# Patient Record
Sex: Male | Born: 1980 | Race: Black or African American | Hispanic: No | Marital: Married | Smoking: Current every day smoker
Health system: Southern US, Community
[De-identification: ages and names within clinical notes are randomized; demographics above are authoritative.]

## PROBLEM LIST (undated history)

## (undated) DIAGNOSIS — K6289 Other specified diseases of anus and rectum: Secondary | ICD-10-CM

## (undated) DIAGNOSIS — R05 Cough: Secondary | ICD-10-CM

## (undated) DIAGNOSIS — R059 Cough, unspecified: Secondary | ICD-10-CM

---
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
## 2000-05-04 ENCOUNTER — Emergency Department (HOSPITAL_COMMUNITY): Admission: EM | Admit: 2000-05-04 | Discharge: 2000-05-04 | Payer: Self-pay | Admitting: Emergency Medicine

## 2000-05-04 ENCOUNTER — Encounter: Payer: Self-pay | Admitting: Emergency Medicine

## 2001-08-20 ENCOUNTER — Emergency Department (HOSPITAL_COMMUNITY): Admission: EM | Admit: 2001-08-20 | Discharge: 2001-08-20 | Payer: Self-pay | Admitting: Emergency Medicine

## 2003-02-15 ENCOUNTER — Emergency Department (HOSPITAL_COMMUNITY): Admission: EM | Admit: 2003-02-15 | Discharge: 2003-02-15 | Payer: Self-pay | Admitting: Emergency Medicine

## 2005-08-07 ENCOUNTER — Emergency Department (HOSPITAL_COMMUNITY): Admission: EM | Admit: 2005-08-07 | Discharge: 2005-08-07 | Payer: Self-pay | Admitting: Emergency Medicine

## 2005-11-25 ENCOUNTER — Emergency Department (HOSPITAL_COMMUNITY): Admission: EM | Admit: 2005-11-25 | Discharge: 2005-11-25 | Payer: Self-pay | Admitting: Emergency Medicine

## 2006-02-12 ENCOUNTER — Emergency Department (HOSPITAL_COMMUNITY): Admission: EM | Admit: 2006-02-12 | Discharge: 2006-02-12 | Payer: Self-pay | Admitting: Emergency Medicine

## 2006-03-19 ENCOUNTER — Emergency Department (HOSPITAL_COMMUNITY): Admission: EM | Admit: 2006-03-19 | Discharge: 2006-03-19 | Payer: Self-pay | Admitting: Emergency Medicine

## 2006-07-14 HISTORY — PX: LIPOSUCTION TRUNK: SUR833

## 2006-07-23 ENCOUNTER — Emergency Department (HOSPITAL_COMMUNITY): Admission: EM | Admit: 2006-07-23 | Discharge: 2006-07-24 | Payer: Self-pay | Admitting: Emergency Medicine

## 2006-12-14 ENCOUNTER — Emergency Department (HOSPITAL_COMMUNITY): Admission: EM | Admit: 2006-12-14 | Discharge: 2006-12-14 | Payer: Self-pay | Admitting: Emergency Medicine

## 2007-08-10 IMAGING — CR DG CHEST 2V
2 series · 2 of 2 positions shown · non-contrast
Comparison: none

CLINICAL DATA: Fever.  Nausea and vomiting.  Flu symptoms. 
 CHEST - 2 VIEW:

[w chest pa]
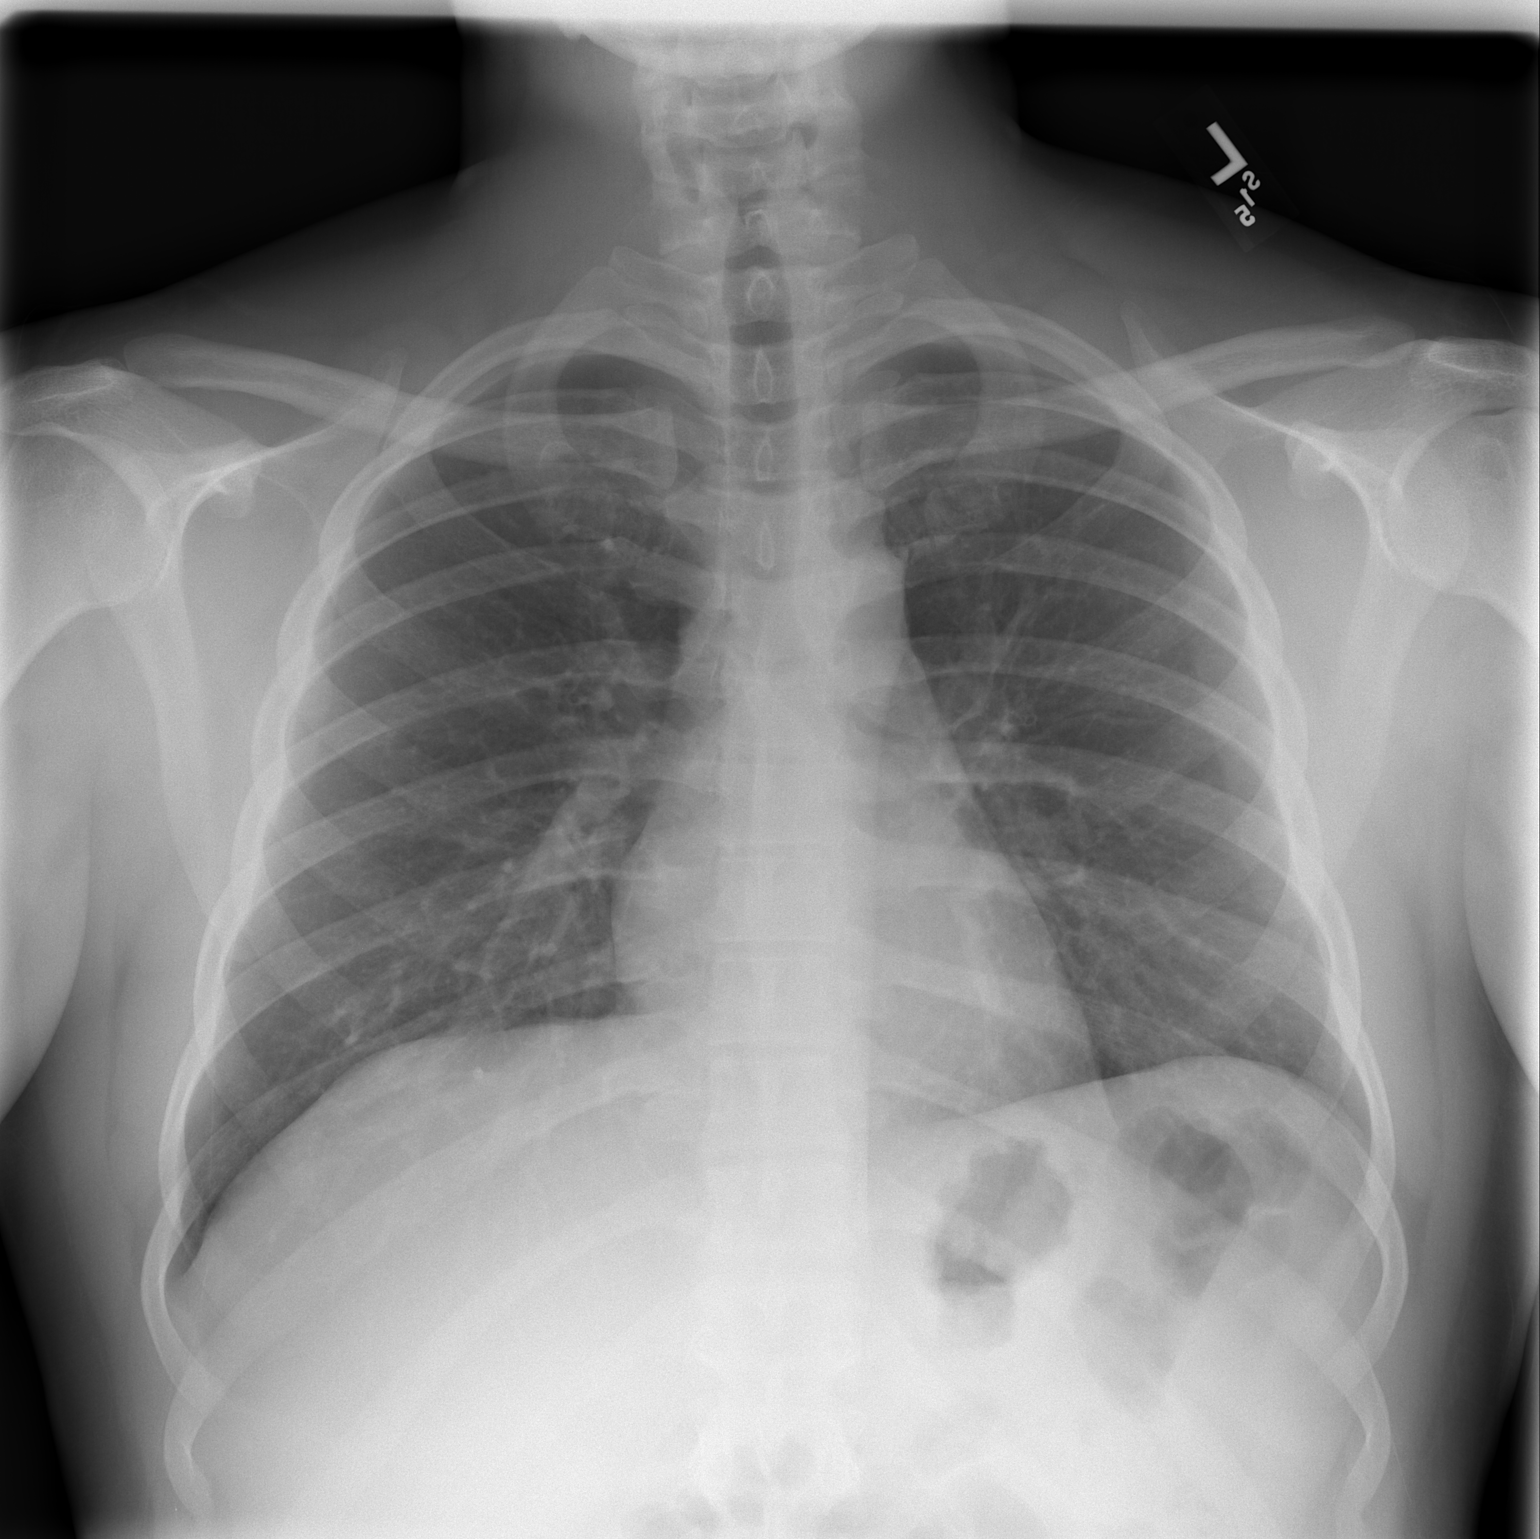

[w chest lat]
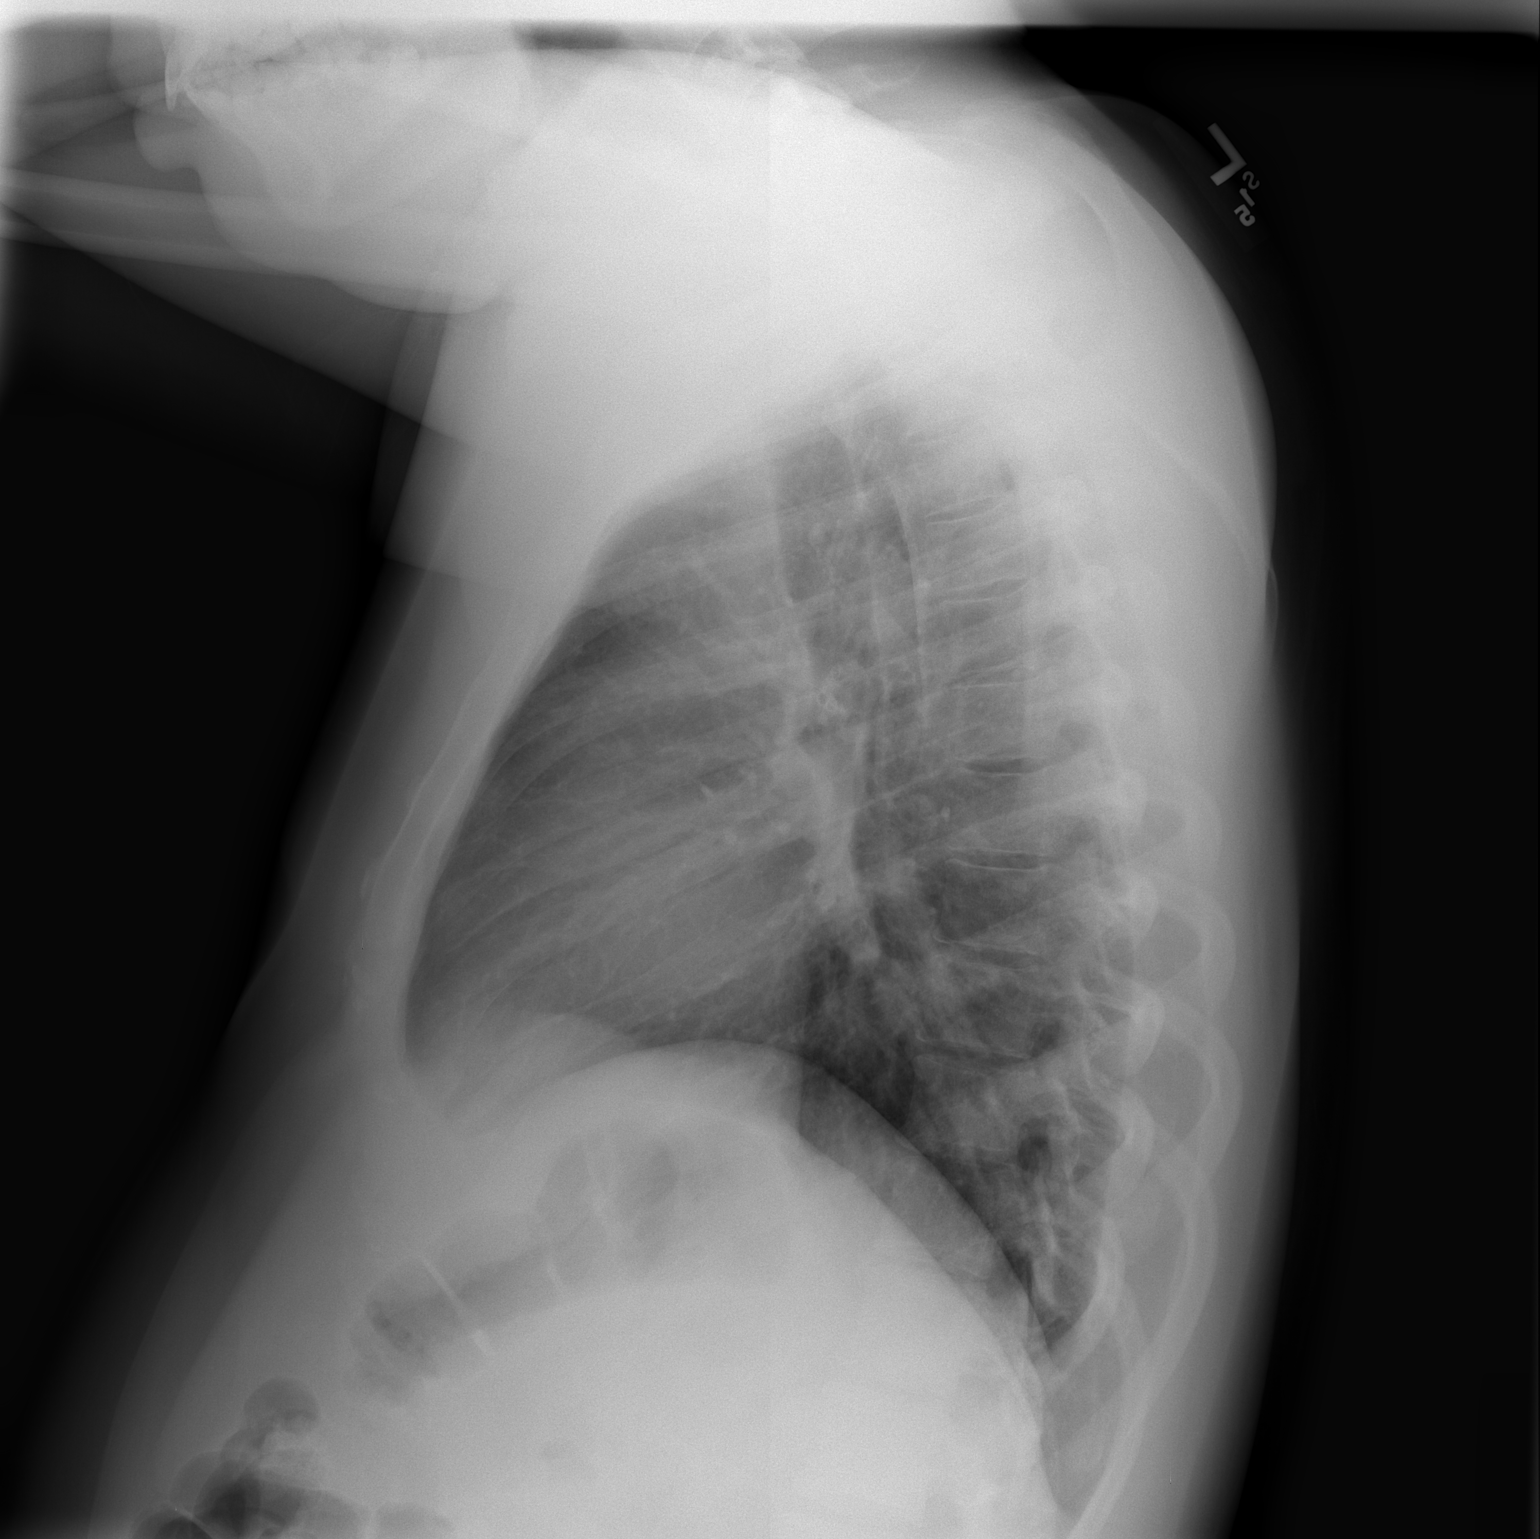

[2 of 2 positions shown; findings below may reference images not displayed]

FINDINGS: The heart size and mediastinal contours are within normal limits.  Both lungs are clear.  The visualized skeletal structures are unremarkable.
IMPRESSION: No active cardiopulmonary disease.

## 2007-11-24 ENCOUNTER — Emergency Department (HOSPITAL_COMMUNITY): Admission: EM | Admit: 2007-11-24 | Discharge: 2007-11-24 | Payer: Self-pay | Admitting: Emergency Medicine

## 2008-02-28 ENCOUNTER — Emergency Department (HOSPITAL_COMMUNITY): Admission: EM | Admit: 2008-02-28 | Discharge: 2008-02-29 | Payer: Self-pay | Admitting: Emergency Medicine

## 2008-06-26 ENCOUNTER — Emergency Department (HOSPITAL_COMMUNITY): Admission: EM | Admit: 2008-06-26 | Discharge: 2008-06-26 | Payer: Self-pay | Admitting: Emergency Medicine

## 2010-10-16 ENCOUNTER — Emergency Department (HOSPITAL_COMMUNITY)
Admission: EM | Admit: 2010-10-16 | Discharge: 2010-10-16 | Disposition: A | Discharge status: Home / Self Care | Payer: Self-pay | Attending: Emergency Medicine | Admitting: Emergency Medicine

## 2010-10-16 ENCOUNTER — Emergency Department (HOSPITAL_COMMUNITY): Payer: Self-pay

## 2010-10-16 DIAGNOSIS — R05 Cough: Secondary | ICD-10-CM | POA: Insufficient documentation

## 2010-10-16 DIAGNOSIS — R059 Cough, unspecified: Secondary | ICD-10-CM | POA: Insufficient documentation

## 2010-10-16 DIAGNOSIS — J45909 Unspecified asthma, uncomplicated: Secondary | ICD-10-CM | POA: Insufficient documentation

## 2010-10-17 ENCOUNTER — Emergency Department (HOSPITAL_COMMUNITY)
Admission: EM | Admit: 2010-10-17 | Discharge: 2010-10-17 | Disposition: A | Discharge status: Home / Self Care | Payer: Self-pay | Attending: Emergency Medicine | Admitting: Emergency Medicine

## 2010-10-17 DIAGNOSIS — J40 Bronchitis, not specified as acute or chronic: Secondary | ICD-10-CM | POA: Insufficient documentation

## 2010-11-21 ENCOUNTER — Emergency Department (HOSPITAL_COMMUNITY)
Admission: EM | Admit: 2010-11-21 | Discharge: 2010-11-21 | Disposition: A | Discharge status: Home / Self Care | Payer: Self-pay | Attending: Emergency Medicine | Admitting: Emergency Medicine

## 2010-11-21 DIAGNOSIS — K6289 Other specified diseases of anus and rectum: Secondary | ICD-10-CM | POA: Insufficient documentation

## 2010-11-27 ENCOUNTER — Emergency Department (HOSPITAL_COMMUNITY)
Admission: EM | Admit: 2010-11-27 | Discharge: 2010-11-27 | Disposition: A | Discharge status: Home / Self Care | Payer: Self-pay | Attending: Emergency Medicine | Admitting: Emergency Medicine

## 2010-11-27 DIAGNOSIS — K6289 Other specified diseases of anus and rectum: Secondary | ICD-10-CM | POA: Insufficient documentation

## 2010-11-27 DIAGNOSIS — K5900 Constipation, unspecified: Secondary | ICD-10-CM | POA: Insufficient documentation

## 2011-04-18 LAB — CBC
HCT: 48.8 % (ref 39.0–52.0)
Platelets: 217 10*3/uL (ref 150–400)
WBC: 5.6 10*3/uL (ref 4.0–10.5)

## 2011-04-18 LAB — COMPREHENSIVE METABOLIC PANEL
ALT: 71 U/L — ABNORMAL HIGH (ref 0–53)
AST: 43 U/L — ABNORMAL HIGH (ref 0–37)
Albumin: 4 g/dL (ref 3.5–5.2)
Alkaline Phosphatase: 39 U/L (ref 39–117)
Chloride: 103 mEq/L (ref 96–112)
GFR calc Af Amer: >60 mL/min (ref >60)
Potassium: 3.9 mEq/L (ref 3.5–5.1)
Sodium: 135 mEq/L (ref 135–145)
Total Bilirubin: 0.6 mg/dL (ref 0.3–1.2)
Total Protein: 6.5 g/dL (ref 6.0–8.3)

## 2011-04-18 LAB — DIFFERENTIAL
Basophils Absolute: 0 10*3/uL (ref 0.0–0.1)
Basophils Relative: 0 % (ref 0–1)
Eosinophils Relative: 3 % (ref 0–5)
Monocytes Absolute: 0.6 10*3/uL (ref 0.1–1.0)
Monocytes Relative: 11 % (ref 3–12)

## 2013-09-20 ENCOUNTER — Encounter (HOSPITAL_COMMUNITY): Payer: Self-pay | Admitting: Emergency Medicine

## 2013-09-20 ENCOUNTER — Emergency Department (HOSPITAL_COMMUNITY)
Admission: EM | Admit: 2013-09-20 | Discharge: 2013-09-20 | Disposition: A | Discharge status: Home / Self Care | Payer: Self-pay | Attending: Emergency Medicine | Admitting: Emergency Medicine

## 2013-09-20 DIAGNOSIS — K921 Melena: Secondary | ICD-10-CM | POA: Insufficient documentation

## 2013-09-20 DIAGNOSIS — Z87891 Personal history of nicotine dependence: Secondary | ICD-10-CM | POA: Insufficient documentation

## 2013-09-20 DIAGNOSIS — K6289 Other specified diseases of anus and rectum: Secondary | ICD-10-CM | POA: Insufficient documentation

## 2013-09-20 MED ORDER — NITROGLYCERIN 2 % TD OINT: 1.0000 [in_us] | TOPICAL_OINTMENT | Freq: Two times a day (BID) | TRANSDERMAL | Status: DC

## 2013-09-20 MED ORDER — NITROGLYCERIN 0.4 % RE OINT: 1.0000 [in_us] | TOPICAL_OINTMENT | Freq: Two times a day (BID) | RECTAL | Status: DC

## 2013-09-20 MED ORDER — DOCUSATE SODIUM 100 MG PO CAPS: 100.0000 mg | ORAL_CAPSULE | Freq: Two times a day (BID) | ORAL | Status: DC

## 2013-09-20 MED ORDER — POLYETHYLENE GLYCOL 3350 17 G PO PACK: 17.0000 g | PACK | Freq: Every day | ORAL | Status: DC

## 2013-09-20 NOTE — ED Provider Notes (Signed)
CSN: [id number]     Arrival date & time 09/20/13  1904 History   First MD Initiated Contact with Patient 09/20/13 2124     Chief Complaint  Patient presents with  . Anal Fissure     (Consider location/radiation/quality/duration/timing/severity/associated sxs/prior Treatment) HPI Comments: 33 yo male presents with rectal pain since yesterday. States it feels similar to the time he was diagnosed with anal fissure 2-3 years ago. Was seen by a surgeon who put him on nitroglycerin ointment and his pain resolved in 2-3 days. Now feels like he has a recurrence. Has had constipation (BM every 2-3 days) with hard stools. Has constant rectal pain. Denies pain on the inside. No abdominal pain. Has had scant amounts of rectal bleeding, no blood mixed with stools. No fevers.   History reviewed. No pertinent past medical history. History reviewed. No pertinent past surgical history. History reviewed. No pertinent family history. History  Substance Use Topics  . Smoking status: Former Games developermoker  . Smokeless tobacco: Not on file  . Alcohol Use: No    Review of Systems  Constitutional: Negative for fever.  Gastrointestinal: Positive for constipation, anal bleeding and rectal pain. Negative for nausea, vomiting and abdominal pain.  All other systems reviewed and are negative.      Allergies  Review of patient's allergies indicates no known allergies.  Home Medications   Current Outpatient Rx  Name  Route  Sig  Dispense  Refill  . docusate sodium (COLACE) 100 MG capsule   Oral   Take 1 capsule (100 mg total) by mouth every 12 (twelve) hours.   60 capsule   0   . Nitroglycerin 0.4 % OINT   Rectal   Place 1 inch rectally 2 (two) times daily.   30 g   0   . polyethylene glycol (MIRALAX / GLYCOLAX) packet   Oral   Take 17 g by mouth daily.   14 each   0    BP 120/89  Pulse 90  Temp(Src) 98.2 F (36.8 C) (Oral)  Resp 18  SpO2 95% Physical Exam  Nursing note and vitals  reviewed. Constitutional: He is oriented to person, place, and time. He appears well-developed and well-nourished. No distress.  HENT:  Head: Normocephalic and atraumatic.  Right Ear: External ear normal.  Left Ear: External ear normal.  Nose: Nose normal.  Eyes: Right eye exhibits no discharge. Left eye exhibits no discharge.  Neck: Neck supple.  Cardiovascular: Normal rate.   Pulmonary/Chest: Effort normal.  Abdominal: Soft. He exhibits no distension. There is no tenderness.  Genitourinary:     Musculoskeletal: He exhibits no edema.  Neurological: He is alert and oriented to person, place, and time.  Skin: Skin is warm and dry.    ED Course  Procedures (including critical care time) Labs Review Labs Reviewed - No data to display Imaging Review No results found.   EKG Interpretation None      MDM   Final diagnoses:  Rectal pain    Patient refused internal rectal exam. He was not relaxed during external exam, and hard to see extent or amount of fissures. No external hemorrhoids seen. No erythema or abscess. No signs of acute blood loss given history, no anemia symptoms and normal vitals. Will treat symptomatically and refer back to surgeon.    Audree CamelScott T Harriet Bollen, MD 09/21/13 1007

## 2013-09-20 NOTE — Discharge Instructions (Signed)
Anal Fistula °An anal fistula is an abnormal tunnel that develops between the bowel and skin near the outside of the anus, where feces comes out. The anus has a number of tiny glands that make lubricating fluid. Sometimes these glands can become plugged and infected. This may lead to the development of a fluid-filled pocket (abscess). An anal fistula often develops after this infection or abscess. It is nearly always caused by a past or current anal abscess.  °CAUSES  °Though an anal fistula is almost always caused by a past or current anal abscess, other causes can include: °· A complication of surgery. °· Trauma to the rectal area. °· Radiation to the area. °· Other medical conditions or diseases, such as:   °· Chronic inflammatory bowel disease, such as Crohn disease or ulcerative colitis.   °· Colon or rectal cancer.   °· Diverticular disease, such as diverticulitis.   °· A sexually transmitted disease, such as gonorrhea, chlamydia, or syphilis. °· An HIV infection or AIDS.   °SYMPTOMS  °· Throbbing or constant pain that may be worse when sitting.   °· Swelling or irritation around the anus.   °· Drainage of pus or blood from an opening near the anus.   °· Pain with bowel movements. °· Fever or chills. °DIAGNOSIS  °Your caregiver will examine the area to find the openings of the anal fistula and the fistula tract. The external opening of the anal fistula may be seen during a physical examination. Other examinations that may be performed include:  °· Examination of the rectal area with a gloved hand (digital rectal exam).   °· Examination with a probe or scope to help locate the internal opening of the fistula.   °· Injection of a dye into the fistula opening. X-rays can be taken to find the exact location and path of the fistula.   °· An MRI or ultrasound of the anal area.   °Other tests may be performed to find the cause of the anal fistula.    °TREATMENT  °The most common treatment for an anal fistula is  surgery. There are different surgery options depending on where your fistula is located and how complex the fistula is. Surgical options include: °· A fistulotomy. This surgery involves opening up the whole fistula and draining the contents inside to promote healing. °· Seton placement. A silk string (seton) is placed into the fistula during a fistulotomy to drain any infection to promote healing. °· Advancement flap procedure. Tissue is removed from your rectum or the skin around the anus and is attached to the opening of the fistula. °· Bioprosthetic plug. A cone-shaped plug is made from your tissue and is used to block the opening of the fistula. °Some anal fistulas do not require surgery. A fibrin glue is a non-surgical option that involves injecting the glue to seal the fistula. You also may be prescribed an antibiotic medicine to treat an infection.  °HOME CARE INSTRUCTIONS  °· Take your antibiotics as directed. Finish them even if you start to feel better. °· Only take over-the-counter or prescription medicines as directed by your caregiver. Use a stool softener or laxative, if recommended.   °· Eat a high-fiber diet to help avoid constipation or as directed by your caregiver. °· Drink enough water to keep your urine clear or pale yellow.   °· A warm sitz bath may be soothing and help with healing. You may take warm sitz baths for 15 20 minutes, 3 4 times a day to ease pain and discomfort.   °· Follow excellent hygiene to keep the   anal area as clean and dry as possible. Use wet toilet paper or moist towelettes after each bowel movement.   °SEEK MEDICAL CARE IF: °You have increased pain not controlled with medicines.  °SEEK IMMEDIATE MEDICAL CARE IF: °· You have severe, intolerable pain. °· You have new swelling, redness, or discharge around the anal area. °· You have tenderness or warmth around the anal area. °· You have chills or diarrhea. °· You have severe problems urinating or having a bowel movement.    °· You have a fever or persistent symptoms for more than 2 3 days.   °· You have a fever and your symptoms suddenly get worse.   °MAKE SURE YOU:  °· Understand these instructions. °· Will watch your condition. °· Will get help right away if you are not doing well or get worse. °Document Released: 06/12/2008 Document Revised: 06/16/2012 Document Reviewed: 05/05/2011 °[employer]® Patient Information ©2014 [employer]. ° °

## 2013-09-20 NOTE — ED Notes (Signed)
Pt reports having a bowel movement a began having bleeding rectally. Pt requesting to have NTG bc it helps stop the bleeding. Pt standing and walking around in room. Pt states that he is bleeding a lot and removed one packing d/t bleeding and replaced it with another. Pt repeatedly states that he just wants to get meds and does not want an examination. Pt alert and oriented.

## 2013-09-20 NOTE — ED Notes (Signed)
Pt coming to desk insisting that he needs to go back first because of pain and that he 'only needs a prescription.' Pt reassured that we would get him back in a timely manner. Ambulatory in waiting room. Alert and oriented.

## 2013-09-21 ENCOUNTER — Telehealth (HOSPITAL_COMMUNITY): Payer: Self-pay | Admitting: *Deleted

## 2013-09-21 NOTE — ED Notes (Signed)
Patient called stated that Nitroglycerin 0.4%  Oint was to expensive. States that he has spolen with Pharmacist and 2% was cheaper. Call to see if med could be changed, Dr Dierdre Highmanpitz ok"d change to 2%. Patient to call back with Pharmacy name for call in.

## 2015-02-19 ENCOUNTER — Ambulatory Visit: Payer: Self-pay | Attending: Internal Medicine

## 2015-07-29 ENCOUNTER — Emergency Department (HOSPITAL_COMMUNITY)
Admission: EM | Admit: 2015-07-29 | Discharge: 2015-07-29 | Disposition: A | Discharge status: Home / Self Care | Payer: Medicaid Other | Attending: Emergency Medicine | Admitting: Emergency Medicine

## 2015-07-29 DIAGNOSIS — I10 Essential (primary) hypertension: Secondary | ICD-10-CM | POA: Insufficient documentation

## 2015-07-29 DIAGNOSIS — R51 Headache: Secondary | ICD-10-CM | POA: Insufficient documentation

## 2015-07-29 NOTE — ED Notes (Signed)
Patient states that he had hypertension at home. 124/70 currently. Patient does not want to be seen since BP is normal.

## 2015-08-21 ENCOUNTER — Emergency Department (HOSPITAL_COMMUNITY)
Admission: EM | Admit: 2015-08-21 | Discharge: 2015-08-21 | Disposition: A | Discharge status: Home / Self Care | Payer: Medicaid Other | Source: Home / Self Care | Attending: Family Medicine | Admitting: Family Medicine

## 2015-08-21 ENCOUNTER — Encounter (HOSPITAL_COMMUNITY): Payer: Self-pay | Admitting: Emergency Medicine

## 2015-08-21 DIAGNOSIS — L989 Disorder of the skin and subcutaneous tissue, unspecified: Secondary | ICD-10-CM

## 2015-08-21 NOTE — ED Notes (Signed)
Here needing a referral to see a Dermatologist for his condition (acanthosis nigricans) A&O x4... No acute distress.

## 2015-08-21 NOTE — Discharge Instructions (Signed)
Call to see about arrangement for skin biopsy

## 2015-08-21 NOTE — ED Provider Notes (Signed)
CSN: [id number]     Arrival date & time 08/21/15  1721 History   First MD Initiated Contact with Patient 08/21/15 1834     No chief complaint on file.  (Consider location/radiation/quality/duration/timing/severity/associated sxs/prior Treatment) HPI PT  States he is new to the area, was working with a dermatologist do it due to his skin condition dermatology was about to do a skin biopsy when he had to move. He states he is only here to try and get a referral to dermatology. He states it is called on to dermatology offices in the advised him to come to urgent care to get a referral. Patient has no complaints at this time. History reviewed. No pertinent past medical history. History reviewed. No pertinent past surgical history. No family history on file. Social History  Substance Use Topics  . Smoking status: Former Games developer  . Smokeless tobacco: None  . Alcohol Use: No    Review of Systems ROS +'ve skin disorder  Denies: HEADACHE, NAUSEA, ABDOMINAL PAIN, CHEST PAIN, CONGESTION, DYSURIA, SHORTNESS OF BREATH  Allergies  Review of patient's allergies indicates no known allergies.  Home Medications   Prior to Admission medications   Medication Sig Start Date End Date Taking? Authorizing Provider  acetaminophen (TYLENOL) 500 MG chewable tablet Chew 1,000 mg by mouth every 6 (six) hours as needed for pain.    Historical Provider, MD  docusate sodium (COLACE) 100 MG capsule Take 1 capsule (100 mg total) by mouth every 12 (twelve) hours. Patient not taking: Reported on 07/29/2015 09/20/13   Pricilla Loveless, MD  GLUTATHIONE PO Take 2 tablets by mouth daily.    Historical Provider, MD  Nitroglycerin 0.4 % OINT Place 1 inch rectally 2 (two) times daily. Patient not taking: Reported on 07/29/2015 09/20/13   Pricilla Loveless, MD  polyethylene glycol [facility] / Ethelene Hal) packet Take 17 g by mouth daily. Patient not taking: Reported on 07/29/2015 09/20/13   Pricilla Loveless, MD   Meds Ordered and  Administered this Visit  Medications - No data to display  BP 121/71 mmHg  Pulse 83  Temp(Src) 98.2 F (36.8 C) (Oral)  Resp 16  SpO2 99% No data found.   Physical Exam  Constitutional: He appears well-developed and well-nourished.  HENT:  Head: Normocephalic and atraumatic.  Eyes: Conjunctivae are normal.  Pulmonary/Chest: Effort normal.  Musculoskeletal: Normal range of motion.  Neurological: He is alert.  Skin: Skin is warm and dry.  tint of skin is very uneven  Psychiatric: He has a normal mood and affect. His behavior is normal.    ED Course  Procedures (including critical care time)  Labs Review Labs Reviewed - No data to display  Imaging Review No results found.   Visual Acuity Review  Right Eye Distance:   Left Eye Distance:   Bilateral Distance:    Right Eye Near:   Left Eye Near:    Bilateral Near:         MDM   1. Skin disorder    Follow-up with [facility] please call for an appointment.    Tharon Aquas, [place] 08/21/15 [phone]

## 2015-11-27 ENCOUNTER — Other Ambulatory Visit: Payer: Self-pay | Admitting: General Surgery

## 2015-11-27 NOTE — H&P (Signed)
History of Present Illness Manuel Graham(Manuel Gathright MD; 11/27/2015 9:14 AM) The patient is a 35 year old male who presents with a complaint of anal problems. Pt with h/o anal fissure last year that resolved with ntg cream. Now with new fissure. Also, pt developed a buttock abscess that was drained a month ago and is still having drainage occasionally at that spot. He does endorse a h/o constipation but has been changing his diet and adding more fiber to help with this.    Other Problems Manuel Graham(Manuel Dockery, LPN; 4/09/81195/16/2017 1:479:17 AM) No pertinent past medical history  Past Surgical History Manuel Graham(Manuel Dockery, LPN; 8/29/56215/16/2017 3:089:17 AM) No pertinent past surgical history  Diagnostic Studies History Manuel Graham(Manuel Dockery, LPN; 6/57/84695/16/2017 6:299:17 AM) Colonoscopy never  Allergies Manuel Graham(Manuel Graham, CMA; 11/27/2015 8:50 AM) No Known Drug Allergies 11/27/2015  Medication History Manuel Graham(Manuel Graham, CMA; 11/27/2015 8:51 AM) Tylenol (325MG  Tablet, Oral) Active. Nitroglycerin (0.4% Ointment, Rectal) Active. Medications Reconciled  Social History Manuel Graham(Manuel Dockery, LPN; 5/28/41325/16/2017 4:409:17 AM) No alcohol use Tobacco use Current some day smoker. No caffeine use No drug use  Family History Manuel Graham(Manuel Dockery, LPN; 1/02/72535/16/2017 6:649:17 AM) First Degree Relatives No pertinent family history     Review of Systems Manuel Graham(Manuel Dockery LPN; 4/03/47425/16/2017 5:959:17 AM) General Not Present- Appetite Loss, Chills, Fatigue, Fever, Night Sweats, Weight Gain and Weight Loss. Skin Not Present- Change in Wart/Mole, Dryness, Hives, Jaundice, New Lesions, Non-Healing Wounds, Rash and Ulcer. HEENT Not Present- Earache, Hearing Loss, Hoarseness, Nose Bleed, Oral Ulcers, Ringing in the Ears, Seasonal Allergies, Sinus Pain, Sore Throat, Visual Disturbances, Wears glasses/contact lenses and Yellow Eyes. Respiratory Not Present- Bloody sputum, Chronic Cough, Difficulty Breathing, Snoring and Wheezing. Breast Not Present- Breast Mass, Breast Pain, Nipple Discharge and Skin  Changes. Cardiovascular Not Present- Chest Pain, Difficulty Breathing Lying Down, Leg Cramps, Palpitations, Rapid Heart Rate, Shortness of Breath and Swelling of Extremities. Gastrointestinal Present- Bloody Stool, Constipation and Rectal Pain. Not Present- Abdominal Pain, Bloating, Change in Bowel Habits, Chronic diarrhea, Difficulty Swallowing, Excessive gas, Gets full quickly at meals, Hemorrhoids, Indigestion, Nausea and Vomiting. Male Genitourinary Not Present- Blood in Urine, Change in Urinary Stream, Frequency, Impotence, Nocturia, Painful Urination, Urgency and Urine Leakage. Musculoskeletal Not Present- Back Pain, Joint Pain, Joint Stiffness, Muscle Pain, Muscle Weakness and Swelling of Extremities. Neurological Not Present- Decreased Memory, Fainting, Headaches, Numbness, Seizures, Tingling, Tremor, Trouble walking and Weakness. Psychiatric Not Present- Anxiety, Bipolar, Change in Sleep Pattern, Depression, Fearful and Frequent crying. Endocrine Not Present- Cold Intolerance, Excessive Hunger, Hair Changes, Heat Intolerance, Hot flashes and New Diabetes. Hematology Not Present- Easy Bruising, Excessive bleeding, Gland problems, HIV and Persistent Infections.  Vitals Manuel Graham(Manuel Graham CMA; 11/27/2015 8:51 AM) 11/27/2015 8:51 AM Weight: 217 lb Height: 67in Body Surface Area: 2.09 m Body Mass Index: 33.99 kg/m  Temp.: 98.67F  Pulse: 88 (Regular)  BP: 130/74 (Sitting, Left Arm, Standard)      Physical Exam Manuel Graham(Manuel Beckstrom MD; 11/27/2015 9:15 AM)  General Mental Status-Alert. General Appearance-Not in acute distress. Build & Nutrition-Well nourished. Posture-Normal posture. Gait-Normal.  Head and Neck Head-normocephalic, atraumatic with no lesions or palpable masses. Trachea-midline.  Chest and Lung Exam Chest and lung exam reveals -on auscultation, normal breath sounds, no adventitious sounds and normal vocal resonance.  Cardiovascular Cardiovascular  examination reveals -normal heart sounds, regular rate and rhythm with no murmurs.  Abdomen Inspection Inspection of the abdomen reveals - No Hernias. Palpation/Percussion Palpation and Percussion of the abdomen reveal - Soft, Non Tender, No Rigidity (guarding), No hepatosplenomegaly and No Palpable abdominal  masses.  Rectal Anorectal Exam External - Note: no fissure noted, R anterior external opening noted.  Neurologic Neurologic evaluation reveals -alert and oriented x 3 with no impairment of recent or remote memory, normal attention span and ability to concentrate, normal sensation and normal coordination.  Musculoskeletal Normal Exam - Bilateral-Upper Extremity Strength Normal and Lower Extremity Strength Normal.    Assessment & Plan Manuel Levee MD; 11/27/2015 9:20 AM)  ANAL FISTULA (K60.3) Impression: 35 year old male who presents to the office with continued drainage from abscess site status post I&D approximately 1 month ago. Patient also with a history of anal fissure and constipation. On exam he has a right anterior I&D site. I suspect this is the area of his possible fistula. I have recommended an anal exam under anesthesia with possible fistulotomy or seton placement. We will also evaluate his fissure.  Current Plans Pt Education - CCS Abscess/Fistula (AT): discussed with patient and provided information. CHRONIC CONSTIPATION (K59.09) Impression: Patient also with constipation. This has improved by increasing his fiber. He does drink plenty of water on a daily basis. I have recommended that he work on increasing his fiber intake to approximately 30 g a day.

## 2015-12-03 ENCOUNTER — Encounter (HOSPITAL_BASED_OUTPATIENT_CLINIC_OR_DEPARTMENT_OTHER): Payer: Self-pay | Admitting: *Deleted

## 2015-12-03 NOTE — Progress Notes (Addendum)
NPO AFTER MN.  ARRIVE AT 1000.  NEED HG.  START TIME CHANGED FOR PT PROCEDURE TO 0930.  CALLED AND LM FOR PT VIA PHONE TO ARRIVE AT 0800 DUE TO TIME CHANGE.  ASKED PT TO CALL BACK TO CONFIRM HE RECEIVED MESSAGE.

## 2015-12-05 ENCOUNTER — Ambulatory Visit (HOSPITAL_BASED_OUTPATIENT_CLINIC_OR_DEPARTMENT_OTHER): Payer: Medicaid Other | Admitting: Anesthesiology

## 2015-12-05 ENCOUNTER — Encounter (HOSPITAL_BASED_OUTPATIENT_CLINIC_OR_DEPARTMENT_OTHER)
Admission: RE | Disposition: A | Discharge status: Home / Self Care | Payer: Self-pay | Source: Ambulatory Visit | Attending: General Surgery

## 2015-12-05 ENCOUNTER — Encounter (HOSPITAL_BASED_OUTPATIENT_CLINIC_OR_DEPARTMENT_OTHER): Payer: Self-pay | Admitting: Anesthesiology

## 2015-12-05 ENCOUNTER — Ambulatory Visit (HOSPITAL_BASED_OUTPATIENT_CLINIC_OR_DEPARTMENT_OTHER)
Admission: RE | Admit: 2015-12-05 | Discharge: 2015-12-05 | Disposition: A | Discharge status: Home / Self Care | Payer: Medicaid Other | Source: Ambulatory Visit | Attending: General Surgery | Admitting: General Surgery

## 2015-12-05 DIAGNOSIS — K5909 Other constipation: Secondary | ICD-10-CM | POA: Diagnosis not present

## 2015-12-05 DIAGNOSIS — K641 Second degree hemorrhoids: Secondary | ICD-10-CM | POA: Insufficient documentation

## 2015-12-05 DIAGNOSIS — F17200 Nicotine dependence, unspecified, uncomplicated: Secondary | ICD-10-CM | POA: Diagnosis not present

## 2015-12-05 DIAGNOSIS — K603 Anal fistula: Secondary | ICD-10-CM | POA: Insufficient documentation

## 2015-12-05 DIAGNOSIS — K640 First degree hemorrhoids: Secondary | ICD-10-CM | POA: Insufficient documentation

## 2015-12-05 HISTORY — DX: Other specified diseases of anus and rectum: K62.89

## 2015-12-05 HISTORY — DX: Cough, unspecified: R05.9

## 2015-12-05 HISTORY — PX: PLACEMENT OF SETON: SHX6029

## 2015-12-05 HISTORY — DX: Cough: R05

## 2015-12-05 HISTORY — PX: EVALUATION UNDER ANESTHESIA WITH ANAL FISTULECTOMY: SHX5621

## 2015-12-05 LAB — POCT HEMOGLOBIN-HEMACUE: Hemoglobin: 16.4 g/dL (ref 13.0–17.0)

## 2015-12-05 SURGERY — EXAM UNDER ANESTHESIA WITH ANAL FISTULECTOMY
Anesthesia: Monitor Anesthesia Care | Site: Rectum

## 2015-12-05 MED ORDER — PROMETHAZINE HCL 25 MG/ML IJ SOLN
6.2500 mg | INTRAMUSCULAR | Status: DC | PRN
  Filled 2015-12-05: qty 1

## 2015-12-05 MED ORDER — OXYCODONE HCL 5 MG PO TABS
ORAL_TABLET | ORAL | Status: AC
  Filled 2015-12-05: qty 1

## 2015-12-05 MED ORDER — SODIUM CHLORIDE 0.9 % IR SOLN
Status: DC | PRN
  Administered 2015-12-05: 500 mL

## 2015-12-05 MED ORDER — ONDANSETRON HCL 4 MG/2ML IJ SOLN
INTRAMUSCULAR | Status: AC
  Filled 2015-12-05: qty 2

## 2015-12-05 MED ORDER — ACETAMINOPHEN 650 MG RE SUPP
650.0000 mg | RECTAL | Status: DC | PRN
  Filled 2015-12-05: qty 1

## 2015-12-05 MED ORDER — MIDAZOLAM HCL 5 MG/5ML IJ SOLN
INTRAMUSCULAR | Status: DC | PRN
  Administered 2015-12-05: 1 mg via INTRAVENOUS
  Administered 2015-12-05: 2 mg via INTRAVENOUS
  Administered 2015-12-05: 1 mg via INTRAVENOUS

## 2015-12-05 MED ORDER — KETAMINE HCL 10 MG/ML IJ SOLN
INTRAMUSCULAR | Status: DC | PRN
  Administered 2015-12-05 (×2): 25 mg via INTRAVENOUS

## 2015-12-05 MED ORDER — KETOROLAC TROMETHAMINE 30 MG/ML IJ SOLN
30.0000 mg | Freq: Once | INTRAMUSCULAR | Status: DC | PRN
  Filled 2015-12-05: qty 1

## 2015-12-05 MED ORDER — PROPOFOL 500 MG/50ML IV EMUL
INTRAVENOUS | Status: DC | PRN
  Administered 2015-12-05: 50 ug/kg/min via INTRAVENOUS

## 2015-12-05 MED ORDER — SODIUM CHLORIDE 0.9 % IV SOLN
250.0000 mL | INTRAVENOUS | Status: DC | PRN
  Filled 2015-12-05: qty 250

## 2015-12-05 MED ORDER — FENTANYL CITRATE (PF) 100 MCG/2ML IJ SOLN
INTRAMUSCULAR | Status: DC | PRN
  Administered 2015-12-05: 25 ug via INTRAVENOUS

## 2015-12-05 MED ORDER — PROPOFOL 500 MG/50ML IV EMUL
INTRAVENOUS | Status: AC
  Filled 2015-12-05: qty 50

## 2015-12-05 MED ORDER — OXYCODONE HCL 5 MG PO TABS
5.0000 mg | ORAL_TABLET | ORAL | Status: DC | PRN
  Administered 2015-12-05: 5 mg via ORAL
  Filled 2015-12-05: qty 2

## 2015-12-05 MED ORDER — HYDROCODONE-ACETAMINOPHEN 7.5-325 MG PO TABS
1.0000 | ORAL_TABLET | Freq: Once | ORAL | Status: DC | PRN
  Filled 2015-12-05: qty 1

## 2015-12-05 MED ORDER — LIDOCAINE HCL (CARDIAC) 20 MG/ML IV SOLN
INTRAVENOUS | Status: AC
  Filled 2015-12-05: qty 5

## 2015-12-05 MED ORDER — KETAMINE HCL 10 MG/ML IJ SOLN
INTRAMUSCULAR | Status: AC
  Filled 2015-12-05: qty 1

## 2015-12-05 MED ORDER — LIDOCAINE 5 % EX OINT
TOPICAL_OINTMENT | CUTANEOUS | Status: DC | PRN
  Administered 2015-12-05: 1

## 2015-12-05 MED ORDER — BUPIVACAINE-EPINEPHRINE 0.5% -1:200000 IJ SOLN
INTRAMUSCULAR | Status: DC | PRN
  Administered 2015-12-05: 30 mL

## 2015-12-05 MED ORDER — ACETAMINOPHEN 325 MG PO TABS
650.0000 mg | ORAL_TABLET | ORAL | Status: DC | PRN
  Filled 2015-12-05: qty 2

## 2015-12-05 MED ORDER — FENTANYL CITRATE (PF) 100 MCG/2ML IJ SOLN
INTRAMUSCULAR | Status: AC
  Filled 2015-12-05: qty 2

## 2015-12-05 MED ORDER — LIDOCAINE HCL (CARDIAC) 20 MG/ML IV SOLN
INTRAVENOUS | Status: DC | PRN
  Administered 2015-12-05: 60 mg via INTRAVENOUS

## 2015-12-05 MED ORDER — HYDROCODONE-ACETAMINOPHEN 5-325 MG PO TABS: 1.0000 | ORAL_TABLET | ORAL | Status: DC | PRN

## 2015-12-05 MED ORDER — SODIUM CHLORIDE 0.9% FLUSH
3.0000 mL | INTRAVENOUS | Status: DC | PRN
  Filled 2015-12-05: qty 3

## 2015-12-05 MED ORDER — HYDROMORPHONE HCL 1 MG/ML IJ SOLN
0.2500 mg | INTRAMUSCULAR | Status: DC | PRN
  Administered 2015-12-05: 0.5 mg via INTRAVENOUS
  Filled 2015-12-05: qty 1

## 2015-12-05 MED ORDER — HYDROMORPHONE HCL 1 MG/ML IJ SOLN
INTRAMUSCULAR | Status: AC
  Filled 2015-12-05: qty 1

## 2015-12-05 MED ORDER — MIDAZOLAM HCL 2 MG/2ML IJ SOLN
INTRAMUSCULAR | Status: AC
  Filled 2015-12-05: qty 4

## 2015-12-05 MED ORDER — SODIUM CHLORIDE 0.9% FLUSH
3.0000 mL | Freq: Two times a day (BID) | INTRAVENOUS | Status: DC
  Filled 2015-12-05: qty 3

## 2015-12-05 MED ORDER — LACTATED RINGERS IV SOLN
INTRAVENOUS | Status: DC
  Administered 2015-12-05 (×2): via INTRAVENOUS
  Filled 2015-12-05: qty 1000

## 2015-12-05 MED ORDER — ONDANSETRON HCL 4 MG/2ML IJ SOLN
INTRAMUSCULAR | Status: DC | PRN
  Administered 2015-12-05: 4 mg via INTRAVENOUS

## 2015-12-05 SURGICAL SUPPLY — 62 items
APL SKNCLS STERI-STRIP NONHPOA (GAUZE/BANDAGES/DRESSINGS) ×2
BENZOIN TINCTURE PRP APPL 2/3 (GAUZE/BANDAGES/DRESSINGS) ×6 IMPLANT
BLADE HEX COATED 2.75 (ELECTRODE) ×3 IMPLANT
BLADE SURG 10 STRL SS (BLADE) IMPLANT
BLADE SURG 15 STRL LF DISP TIS (BLADE) ×1 IMPLANT
BLADE SURG 15 STRL SS (BLADE) ×3
BRIEF STRETCH FOR OB PAD LRG (UNDERPADS AND DIAPERS) ×6 IMPLANT
CANISTER SUCTION 2500CC (MISCELLANEOUS) ×3 IMPLANT
COVER BACK TABLE 60X90IN (DRAPES) ×3 IMPLANT
COVER MAYO STAND STRL (DRAPES) ×3 IMPLANT
DECANTER SPIKE VIAL GLASS SM (MISCELLANEOUS) ×3 IMPLANT
DRAPE LAPAROTOMY 100X72 PEDS (DRAPES) ×3 IMPLANT
DRAPE LG THREE QUARTER DISP (DRAPES) ×3 IMPLANT
DRAPE UNDERBUTTOCKS STRL (DRAPE) IMPLANT
DRAPE UTILITY XL STRL (DRAPES) ×3 IMPLANT
ELECT BLADE 6.5 .24CM SHAFT (ELECTRODE) IMPLANT
ELECT REM PT RETURN 9FT ADLT (ELECTROSURGICAL) ×3
ELECTRODE REM PT RTRN 9FT ADLT (ELECTROSURGICAL) ×1 IMPLANT
GAUZE SPONGE 4X4 16PLY XRAY LF (GAUZE/BANDAGES/DRESSINGS) IMPLANT
GAUZE VASELINE 3X9 (GAUZE/BANDAGES/DRESSINGS) IMPLANT
GLOVE BIO SURGEON STRL SZ 6.5 (GLOVE) ×4 IMPLANT
GLOVE BIO SURGEONS STRL SZ 6.5 (GLOVE) ×2
GLOVE INDICATOR 7.0 STRL GRN (GLOVE) ×6 IMPLANT
GOWN STRL REUS W/ TWL LRG LVL3 (GOWN DISPOSABLE) ×1 IMPLANT
GOWN STRL REUS W/ TWL XL LVL3 (GOWN DISPOSABLE) ×2 IMPLANT
GOWN STRL REUS W/TWL 2XL LVL3 (GOWN DISPOSABLE) ×3 IMPLANT
GOWN STRL REUS W/TWL LRG LVL3 (GOWN DISPOSABLE) ×3
GOWN STRL REUS W/TWL XL LVL3 (GOWN DISPOSABLE) ×6
HOOK RETRACTION 12 ELAST STAY (MISCELLANEOUS) IMPLANT
HYDROGEN PEROXIDE 16OZ (MISCELLANEOUS) ×3 IMPLANT
KIT ROOM TURNOVER WOR (KITS) ×3 IMPLANT
LEGGING LITHOTOMY PAIR STRL (DRAPES) IMPLANT
LOOP VESSEL MAXI BLUE (MISCELLANEOUS) IMPLANT
MANIFOLD NEPTUNE II (INSTRUMENTS) IMPLANT
NDL HYPO 25X1 1.5 SAFETY (NEEDLE) ×1 IMPLANT
NDL SAFETY ECLIPSE 18X1.5 (NEEDLE) IMPLANT
NEEDLE HYPO 18GX1.5 SHARP (NEEDLE)
NEEDLE HYPO 25X1 1.5 SAFETY (NEEDLE) ×3 IMPLANT
NS IRRIG 500ML POUR BTL (IV SOLUTION) ×3 IMPLANT
PACK BASIN DAY SURGERY FS (CUSTOM PROCEDURE TRAY) ×3 IMPLANT
PAD ABD 8X10 STRL (GAUZE/BANDAGES/DRESSINGS) ×3 IMPLANT
PAD ARMBOARD 7.5X6 YLW CONV (MISCELLANEOUS) IMPLANT
PENCIL BUTTON HOLSTER BLD 10FT (ELECTRODE) ×3 IMPLANT
RETRACTOR STERILE 25.8CMX11.3 (INSTRUMENTS) IMPLANT
SPONGE GAUZE 4X4 12PLY STER LF (GAUZE/BANDAGES/DRESSINGS) ×3 IMPLANT
SPONGE SURGIFOAM ABS GEL 12-7 (HEMOSTASIS) IMPLANT
SUCTION FRAZIER HANDLE 10FR (MISCELLANEOUS)
SUCTION TUBE FRAZIER 10FR DISP (MISCELLANEOUS) IMPLANT
SUT CHROMIC 2 0 SH (SUTURE) ×3 IMPLANT
SUT CHROMIC 3 0 SH 27 (SUTURE) IMPLANT
SUT ETHIBOND 0 (SUTURE) IMPLANT
SUT SILK 2 0 (SUTURE)
SUT SILK 2-0 18XBRD TIE 12 (SUTURE) IMPLANT
SUT VIC AB 3-0 SH 18 (SUTURE) IMPLANT
SUT VIC AB 4-0 P-3 18XBRD (SUTURE) IMPLANT
SUT VIC AB 4-0 P3 18 (SUTURE)
SYR CONTROL 10ML LL (SYRINGE) ×3 IMPLANT
TOWEL OR 17X24 6PK STRL BLUE (TOWEL DISPOSABLE) ×3 IMPLANT
TRAY DSU PREP LF (CUSTOM PROCEDURE TRAY) ×3 IMPLANT
TUBE CONNECTING 12'X1/4 (SUCTIONS) ×1
TUBE CONNECTING 12X1/4 (SUCTIONS) ×2 IMPLANT
YANKAUER SUCT BULB TIP NO VENT (SUCTIONS) ×3 IMPLANT

## 2015-12-05 NOTE — Interval H&P Note (Signed)
History and Physical Interval Note:  12/05/2015 8:53 AM  Manuel Graham  has presented today for surgery, with the diagnosis of possible anal fistula  The various methods of treatment have been discussed with the patient and family. After consideration of risks, benefits and other options for treatment, the patient has consented to  Procedure(s): ANAL EXAM UNDER ANESTHESIA WITH POSSIBLE FISTULOTOMY (N/A) PLACEMENT OF SETON (N/A) as a surgical intervention .  The patient's history has been reviewed, patient examined, no change in status, stable for surgery.  I have reviewed the patient's chart and labs.  Questions were answered to the patient's satisfaction.     Vanita PandaAlicia C Chalonda Schlatter, MD  Colorectal and General Surgery [facility]

## 2015-12-05 NOTE — Anesthesia Procedure Notes (Signed)
Procedure Name: MAC Date/Time: 12/05/2015 9:30 AM Performed by: Norva PavlovALLAWAY, Manuel Holsclaw G Pre-anesthesia Checklist: Patient identified, Timeout performed, Emergency Drugs available, Suction available and Patient being monitored Oxygen Delivery Method: Nasal cannula Placement Confirmation: positive ETCO2 and breath sounds checked- equal and bilateral

## 2015-12-05 NOTE — H&P (View-Only) (Signed)
History of Present Illness Manuel Graham(Dalores Weger MD; 11/27/2015 9:14 AM) The patient is a 35 year old male who presents with a complaint of anal problems. Pt with h/o anal fissure last year that resolved with ntg cream. Now with new fissure. Also, pt developed a buttock abscess that was drained a month ago and is still having drainage occasionally at that spot. He does endorse a h/o constipation but has been changing his diet and adding more fiber to help with this.    Other Problems [name](Kelly Dockery, LPN; 4/09/81195/16/2017 1:479:17 AM) No pertinent past medical history  Past Surgical History [name](Kelly Dockery, LPN; 8/29/56215/16/2017 3:089:17 AM) No pertinent past surgical history  Diagnostic Studies History [name](Kelly Dockery, LPN; 6/57/84695/16/2017 6:299:17 AM) Colonoscopy never  Allergies [name]([name], CMA; 11/27/2015 8:50 AM) No Known Drug Allergies 11/27/2015  Medication History [name]([name], CMA; 11/27/2015 8:51 AM) Tylenol (325MG  Tablet, Oral) Active. Nitroglycerin (0.4% Ointment, Rectal) Active. Medications Reconciled  Social History [name](Kelly Dockery, LPN; 5/28/41325/16/2017 4:409:17 AM) No alcohol use Tobacco use Current some day smoker. No caffeine use No drug use  Family History [name](Kelly Dockery, LPN; 1/02/72535/16/2017 6:649:17 AM) First Degree Relatives No pertinent family history     Review of Systems [name](Kelly Dockery LPN; 4/03/47425/16/2017 5:959:17 AM) General Not Present- Appetite Loss, Chills, Fatigue, Fever, Night Sweats, Weight Gain and Weight Loss. Skin Not Present- Change in Wart/Mole, Dryness, Hives, Jaundice, New Lesions, Non-Healing Wounds, Rash and Ulcer. HEENT Not Present- Earache, Hearing Loss, Hoarseness, Nose Bleed, Oral Ulcers, Ringing in the Ears, Seasonal Allergies, Sinus Pain, Sore Throat, Visual Disturbances, Wears glasses/contact lenses and Yellow Eyes. Respiratory Not Present- Bloody sputum, Chronic Cough, Difficulty Breathing, Snoring and Wheezing. Breast Not Present- Breast Mass, Breast Pain, Nipple Discharge and Skin  Changes. Cardiovascular Not Present- Chest Pain, Difficulty Breathing Lying Down, Leg Cramps, Palpitations, Rapid Heart Rate, Shortness of Breath and Swelling of Extremities. Gastrointestinal Present- Bloody Stool, Constipation and Rectal Pain. Not Present- Abdominal Pain, Bloating, Change in Bowel Habits, Chronic diarrhea, Difficulty Swallowing, Excessive gas, Gets full quickly at meals, Hemorrhoids, Indigestion, Nausea and Vomiting. Male Genitourinary Not Present- Blood in Urine, Change in Urinary Stream, Frequency, Impotence, Nocturia, Painful Urination, Urgency and Urine Leakage. Musculoskeletal Not Present- Back Pain, Joint Pain, Joint Stiffness, Muscle Pain, Muscle Weakness and Swelling of Extremities. Neurological Not Present- Decreased Memory, Fainting, Headaches, Numbness, Seizures, Tingling, Tremor, Trouble walking and Weakness. Psychiatric Not Present- Anxiety, Bipolar, Change in Sleep Pattern, Depression, Fearful and Frequent crying. Endocrine Not Present- Cold Intolerance, Excessive Hunger, Hair Changes, Heat Intolerance, Hot flashes and New Diabetes. Hematology Not Present- Easy Bruising, Excessive bleeding, Gland problems, HIV and Persistent Infections.  Vitals [name]([name] CMA; 11/27/2015 8:51 AM) 11/27/2015 8:51 AM Weight: 217 lb Height: 67in Body Surface Area: 2.09 m Body Mass Index: 33.99 kg/m  Temp.: 98.67F  Pulse: 88 (Regular)  BP: 130/74 (Sitting, Left Arm, Standard)      Physical Exam Manuel Graham(Fronia Depass MD; 11/27/2015 9:15 AM)  General Mental Status-Alert. General Appearance-Not in acute distress. Build & Nutrition-Well nourished. Posture-Normal posture. Gait-Normal.  Head and Neck Head-normocephalic, atraumatic with no lesions or palpable masses. Trachea-midline.  Chest and Lung Exam Chest and lung exam reveals -on auscultation, normal breath sounds, no adventitious sounds and normal vocal resonance.  Cardiovascular Cardiovascular  examination reveals -normal heart sounds, regular rate and rhythm with no murmurs.  Abdomen Inspection Inspection of the abdomen reveals - No Hernias. Palpation/Percussion Palpation and Percussion of the abdomen reveal - Soft, Non Tender, No Rigidity (guarding), No hepatosplenomegaly and No Palpable abdominal  masses.  Rectal Anorectal Exam External - Note: no fissure noted, R anterior external opening noted.  Neurologic Neurologic evaluation reveals -alert and oriented x 3 with no impairment of recent or remote memory, normal attention span and ability to concentrate, normal sensation and normal coordination.  Musculoskeletal Normal Exam - Bilateral-Upper Extremity Strength Normal and Lower Extremity Strength Normal.    Assessment & Plan (Christopher Glasscock MD; 11/27/2015 9:20 AM)  ANAL FISTULA (K60.3) Impression: 35-year-old male who presents to the office with continued drainage from abscess site status post I&D approximately 1 month ago. Patient also with a history of anal fissure and constipation. On exam he has a right anterior I&D site. I suspect this is the area of his possible fistula. I have recommended an anal exam under anesthesia with possible fistulotomy or seton placement. We will also evaluate his fissure.  Current Plans Pt Education - CCS Abscess/Fistula (AT): discussed with patient and provided information. CHRONIC CONSTIPATION (K59.09) Impression: Patient also with constipation. This has improved by increasing his fiber. He does drink plenty of water on a daily basis. I have recommended that he work on increasing his fiber intake to approximately 30 g a day. 

## 2015-12-05 NOTE — Transfer of Care (Signed)
Last Vitals:  Filed Vitals:   12/05/15 0907 12/05/15 0958  BP: 119/73   Pulse: 84 96  Temp: 36.7 C   Resp: 16 17    Last Pain: There were no vitals filed for this visit.    Patients Stated Pain Goal: 4 (12/05/15 0912)  Immediate Anesthesia Transfer of Care Note  Patient: Manuel Graham  Procedure(s) Performed: Procedure(s) (LRB): ANAL EXAM UNDER ANESTHESIA  (N/A) PLACEMENT OF SETON (N/A)  Patient Location: PACU  Anesthesia Type: General  Level of Consciousness: awake, alert  and oriented  Airway & Oxygen Therapy: Patient Spontanous Breathing and Patient connected to nasal cannula  oxygen  Post-op Assessment: Report given to PACU RN and Post -op Vital signs reviewed and stable  Post vital signs: Reviewed and stable  Complications: No apparent anesthesia complications

## 2015-12-05 NOTE — Op Note (Signed)
12/05/2015  9:47 AM  PATIENT:  Manuel Graham  35 y.o. male  Patient Care Team: No Pcp Per Patient as PCP - General (General Practice)  PRE-OPERATIVE DIAGNOSIS:   anal fistula  POST-OPERATIVE DIAGNOSIS:   anal fistula  PROCEDURE:  Procedure(s): ANAL EXAM UNDER ANESTHESIA  PLACEMENT OF SETON  SURGEON:  Surgeon(s): Romie LeveeAlicia Cam Harnden, MD  ASSISTANT: none   ANESTHESIA:   local and MAC  SPECIMEN:  No Specimen  DISPOSITION OF SPECIMEN:  N/A  COUNTS:  YES  PLAN OF CARE: Discharge to home after PACU  PATIENT DISPOSITION:  PACU - hemodynamically stable.  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  MAC anesthesia was induced without difficulty. The patient was then positioned in prone jackknife position with buttocks gently taped apart.  The patient was then prepped and draped in usual sterile fashion.  SCDs were noted to be in place prior to the initiation of anesthesia. A surgical timeout was performed indicating the correct patient, procedure, positioning and need for preoperative antibiotics.  A rectal block was performed using Marcaine with epinephrine.    I began with a digital rectal exam.  There were no palpable masses.  I then placed a Hill-Ferguson anoscope into the anal canal and evaluated this completely.  There were no abnormalities noted on anoscopic exam. The patient had some mild grade 1 &2 internal hemorrhoids.  I pushed S-shaped fistula probe into the external opening. This exited at anterior midline. This appeared to involve quite a bit of muscle and a 0 Ethibond suture was pulled through the fistula track and a vessel loop was then placed within the fistula tract for a seton. The vessel loop was secured with 3-0 Ethibond sutures. Patient tolerated  this well. All counts are correct operative staff. The patient was awakened from anesthesia and sent to the postanesthesia care unit in stable condition.

## 2015-12-05 NOTE — Interval H&P Note (Signed)
History and Physical Interval Note:  12/05/2015 9:05 AM  Manuel Graham  has presented today for surgery, with the diagnosis of possible anal fistula  The various methods of treatment have been discussed with the patient and family. After consideration of risks, benefits and other options for treatment, the patient has consented to  Procedure(s): ANAL EXAM UNDER ANESTHESIA WITH POSSIBLE FISTULOTOMY (N/A) PLACEMENT OF SETON (N/A) as a surgical intervention .  The patient's history has been reviewed, patient examined, no change in status, stable for surgery.  I have reviewed the patient's chart and labs.  Questions were answered to the patient's satisfaction.     Vanita PandaAlicia C Natalia Wittmeyer, MD  Colorectal and General Surgery [facility]

## 2015-12-05 NOTE — Anesthesia Preprocedure Evaluation (Addendum)
Anesthesia Evaluation  Patient identified by MRN, date of birth, ID band Patient awake    Reviewed: Allergy & Precautions, NPO status , Patient's Chart, lab work & pertinent test results  Airway Mallampati: II  TM Distance: >3 FB Neck ROM: Full    Dental   Pulmonary Current Smoker,    breath sounds clear to auscultation       Cardiovascular negative cardio ROS   Rhythm:Regular Rate:Normal     Neuro/Psych negative neurological ROS     GI/Hepatic negative GI ROS, Neg liver ROS,   Endo/Other  negative endocrine ROS  Renal/GU negative Renal ROS     Musculoskeletal   Abdominal   Peds  Hematology negative hematology ROS (+)   Anesthesia Other Findings   Reproductive/Obstetrics                            Anesthesia Physical Anesthesia Plan  ASA: II  Anesthesia Plan: MAC   Post-op Pain Management:    Induction: Intravenous  Airway Management Planned: Natural Airway and Simple Face Mask  Additional Equipment:   Intra-op Plan:   Post-operative Plan:   Informed Consent: I have reviewed the patients History and Physical, chart, labs and discussed the procedure including the risks, benefits and alternatives for the proposed anesthesia with the patient or authorized representative who has indicated his/her understanding and acceptance.     Plan Discussed with: CRNA  Anesthesia Plan Comments:         Anesthesia Quick Evaluation

## 2015-12-05 NOTE — Discharge Instructions (Addendum)
Beginning the day after surgery: ° °You may sit in a tub of warm water 2-3 times a day to relieve discomfort. ° °Eat a regular diet high in fiber.  Avoid foods that give you constipation or diarrhea.  Avoid foods that are difficult to digest, such as seeds, nuts, corn or popcorn. ° °Do not go any longer than 2 days without a bowel movement.  You may take a dose of Milk of Magnesia if you become constipated.   ° °Drink 6-8 glasses of water daily. ° °Walking is encouraged.  Avoid strenuous activity and heavy lifting for one month after surgery.   ° °Call the office if you have any questions or concerns.  Call immediately if you develop: ° °· Excessive rectal bleeding (more than a cup or passing large clots) °· Increased discomfort °· Fever greater than 100 F °· Difficulty urinating ° °Post Anesthesia Home Care Instructions ° °Activity: °Get plenty of rest for the remainder of the day. A responsible adult should stay with you for 24 hours following the procedure.  °For the next 24 hours, DO NOT: °-Drive a car °-Operate machinery °-Drink alcoholic beverages °-Take any medication unless instructed by your physician °-Make any legal decisions or sign important papers. ° °Meals: °Start with liquid foods such as gelatin or soup. Progress to regular foods as tolerated. Avoid greasy, spicy, heavy foods. If nausea and/or vomiting occur, drink only clear liquids until the nausea and/or vomiting subsides. Call your physician if vomiting continues. ° °Special Instructions/Symptoms: °Your throat may feel dry or sore from the anesthesia or the breathing tube placed in your throat during surgery. If this causes discomfort, gargle with warm salt water. The discomfort should disappear within 24 hours. ° °If you had a scopolamine patch placed behind your ear for the management of post- operative nausea and/or vomiting: ° °1. The medication in the patch is effective for 72 hours, after which it should be removed.  Wrap patch in a tissue  and discard in the trash. Wash hands thoroughly with soap and water. °2. You may remove the patch earlier than 72 hours if you experience unpleasant side effects which may include dry mouth, dizziness or visual disturbances. °3. Avoid touching the patch. Wash your hands with soap and water after contact with the patch. °  ° °

## 2015-12-06 ENCOUNTER — Encounter (HOSPITAL_BASED_OUTPATIENT_CLINIC_OR_DEPARTMENT_OTHER): Payer: Self-pay | Admitting: General Surgery

## 2015-12-06 NOTE — Anesthesia Postprocedure Evaluation (Signed)
Anesthesia Post Note  Patient: Animal nutritionistMazin Eagles  Procedure(s) Performed: Procedure(s) (LRB): ANAL EXAM UNDER ANESTHESIA  (N/A) PLACEMENT OF SETON (N/A)  Patient location during evaluation: PACU Anesthesia Type: MAC Level of consciousness: awake and alert Pain management: pain level controlled Vital Signs Assessment: post-procedure vital signs reviewed and stable Respiratory status: spontaneous breathing, nonlabored ventilation, respiratory function stable and patient connected to nasal cannula oxygen Cardiovascular status: stable and blood pressure returned to baseline Anesthetic complications: no    Last Vitals:  Filed Vitals:   12/05/15 1145 12/05/15 1615  BP: 121/74 124/83  Pulse: 82 75  Temp:  37 C  Resp: 17 14    Last Pain:  Filed Vitals:   12/05/15 1632  PainSc: 4                  Kennieth RadFitzgerald, Jailynn Lavalais E

## 2016-01-18 ENCOUNTER — Other Ambulatory Visit: Payer: Self-pay | Admitting: General Surgery

## 2016-03-13 NOTE — Progress Notes (Signed)
PT CALLED BACK AFTER MULTIPLE ATTEMPTS TO CONTACT HIM FOR PRE-OP .  HIS PHONE VOICEMAIL HAS BEEN FULL.  PT STATES IS NEEDING TO CANCEL AND RESCHEDULE.  STATED IS HAS TRIED CALLING DR Maisie FusHOMAS AND NO ONE ANSWERS.  I STATED THEY CLOSE AT 1700. TOLD PT HE NEEDED TO CALL  DEBBIE , OR SCHEDULER FOR DR Maisie FusHOMAS AND GAVE HIM HER DIRECT NUMBER , SHE WILL BE IN OFFICE IN AM.

## 2016-03-14 ENCOUNTER — Ambulatory Visit (HOSPITAL_BASED_OUTPATIENT_CLINIC_OR_DEPARTMENT_OTHER): Admission: RE | Admit: 2016-03-14 | Payer: Medicaid Other | Source: Ambulatory Visit | Admitting: General Surgery

## 2016-03-14 ENCOUNTER — Encounter (HOSPITAL_BASED_OUTPATIENT_CLINIC_OR_DEPARTMENT_OTHER): Admission: RE | Payer: Self-pay | Source: Ambulatory Visit

## 2016-03-14 SURGERY — LIGATION, INTERNAL FISTULA TRACT
Anesthesia: General

## 2016-04-24 ENCOUNTER — Other Ambulatory Visit: Payer: Self-pay | Admitting: General Surgery

## 2016-05-01 ENCOUNTER — Encounter (HOSPITAL_BASED_OUTPATIENT_CLINIC_OR_DEPARTMENT_OTHER): Payer: Self-pay

## 2016-05-01 ENCOUNTER — Ambulatory Visit (HOSPITAL_BASED_OUTPATIENT_CLINIC_OR_DEPARTMENT_OTHER): Payer: Medicaid Other | Admitting: Certified Registered"

## 2016-05-01 ENCOUNTER — Encounter (HOSPITAL_BASED_OUTPATIENT_CLINIC_OR_DEPARTMENT_OTHER)
Admission: RE | Disposition: A | Discharge status: Home / Self Care | Payer: Self-pay | Source: Ambulatory Visit | Attending: General Surgery

## 2016-05-01 ENCOUNTER — Ambulatory Visit (HOSPITAL_BASED_OUTPATIENT_CLINIC_OR_DEPARTMENT_OTHER)
Admission: RE | Admit: 2016-05-01 | Discharge: 2016-05-01 | Disposition: A | Discharge status: Home / Self Care | Payer: Medicaid Other | Source: Ambulatory Visit | Attending: General Surgery | Admitting: General Surgery

## 2016-05-01 DIAGNOSIS — K603 Anal fistula: Secondary | ICD-10-CM | POA: Insufficient documentation

## 2016-05-01 DIAGNOSIS — F17210 Nicotine dependence, cigarettes, uncomplicated: Secondary | ICD-10-CM | POA: Insufficient documentation

## 2016-05-01 HISTORY — PX: LIGATION OF INTERNAL FISTULA TRACT: SHX6551

## 2016-05-01 LAB — POCT I-STAT, CHEM 8
BUN: 15 mg/dL (ref 6–20)
Calcium, Ion: 1.24 mmol/L (ref 1.15–1.40)
Chloride: 104 mmol/L (ref 101–111)
Creatinine, Ser: 0.8 mg/dL (ref 0.61–1.24)
Glucose, Bld: 117 mg/dL — ABNORMAL HIGH (ref 65–99)
HEMATOCRIT: 54 % — AB (ref 39.0–52.0)
Hemoglobin: 18.4 g/dL — ABNORMAL HIGH (ref 13.0–17.0)
POTASSIUM: 4.3 mmol/L (ref 3.5–5.1)
SODIUM: 141 mmol/L (ref 135–145)
TCO2: 25 mmol/L (ref 0–100)

## 2016-05-01 SURGERY — LIGATION, INTERNAL FISTULA TRACT
Anesthesia: General | Site: Rectum

## 2016-05-01 MED ORDER — FENTANYL CITRATE (PF) 100 MCG/2ML IJ SOLN
INTRAMUSCULAR | Status: AC
  Filled 2016-05-01: qty 2

## 2016-05-01 MED ORDER — SODIUM CHLORIDE 0.9 % IV SOLN
250.0000 mL | INTRAVENOUS | Status: DC | PRN
  Filled 2016-05-01: qty 250

## 2016-05-01 MED ORDER — LACTATED RINGERS IV SOLN
INTRAVENOUS | Status: DC
  Administered 2016-05-01 (×2): via INTRAVENOUS
  Filled 2016-05-01: qty 1000

## 2016-05-01 MED ORDER — ACETAMINOPHEN 650 MG RE SUPP
650.0000 mg | RECTAL | Status: DC | PRN
  Filled 2016-05-01: qty 1

## 2016-05-01 MED ORDER — ONDANSETRON HCL 4 MG/2ML IJ SOLN
INTRAMUSCULAR | Status: AC
  Filled 2016-05-01: qty 2

## 2016-05-01 MED ORDER — SUCCINYLCHOLINE CHLORIDE 200 MG/10ML IV SOSY
PREFILLED_SYRINGE | INTRAVENOUS | Status: DC | PRN
  Administered 2016-05-01: 120 mg via INTRAVENOUS

## 2016-05-01 MED ORDER — ACETAMINOPHEN 325 MG PO TABS
650.0000 mg | ORAL_TABLET | ORAL | Status: DC | PRN
  Filled 2016-05-01: qty 2

## 2016-05-01 MED ORDER — LIDOCAINE HCL 4 % MT SOLN
OROMUCOSAL | Status: DC | PRN
  Administered 2016-05-01: 3 mL via TOPICAL

## 2016-05-01 MED ORDER — HYDROGEN PEROXIDE 3 % EX SOLN
CUTANEOUS | Status: DC | PRN
  Administered 2016-05-01: 1

## 2016-05-01 MED ORDER — FENTANYL CITRATE (PF) 100 MCG/2ML IJ SOLN
INTRAMUSCULAR | Status: DC | PRN
  Administered 2016-05-01 (×2): 25 ug via INTRAVENOUS
  Administered 2016-05-01: 50 ug via INTRAVENOUS

## 2016-05-01 MED ORDER — OXYCODONE HCL 5 MG PO TABS: 5.0000 mg | ORAL_TABLET | ORAL | 0 refills | Status: DC | PRN

## 2016-05-01 MED ORDER — KETOROLAC TROMETHAMINE 30 MG/ML IJ SOLN
30.0000 mg | Freq: Once | INTRAMUSCULAR | Status: DC | PRN
  Filled 2016-05-01: qty 1

## 2016-05-01 MED ORDER — LIDOCAINE 5 % EX OINT
TOPICAL_OINTMENT | CUTANEOUS | Status: DC | PRN
Start: 2016-05-01 — End: 2016-05-01
  Administered 2016-05-01: 1

## 2016-05-01 MED ORDER — PROPOFOL 10 MG/ML IV BOLUS
INTRAVENOUS | Status: DC | PRN
  Administered 2016-05-01: 200 mg via INTRAVENOUS

## 2016-05-01 MED ORDER — BUPIVACAINE-EPINEPHRINE 0.5% -1:200000 IJ SOLN
INTRAMUSCULAR | Status: DC | PRN
  Administered 2016-05-01: 20 mL

## 2016-05-01 MED ORDER — DEXAMETHASONE SODIUM PHOSPHATE 10 MG/ML IJ SOLN
INTRAMUSCULAR | Status: AC
  Filled 2016-05-01: qty 1

## 2016-05-01 MED ORDER — SODIUM CHLORIDE 0.9% FLUSH
3.0000 mL | INTRAVENOUS | Status: DC | PRN
  Filled 2016-05-01: qty 3

## 2016-05-01 MED ORDER — LIDOCAINE 2% (20 MG/ML) 5 ML SYRINGE
INTRAMUSCULAR | Status: AC
  Filled 2016-05-01: qty 5

## 2016-05-01 MED ORDER — LIDOCAINE 2% (20 MG/ML) 5 ML SYRINGE
INTRAMUSCULAR | Status: DC | PRN
  Administered 2016-05-01: 40 mg via INTRAVENOUS

## 2016-05-01 MED ORDER — PROMETHAZINE HCL 25 MG/ML IJ SOLN
6.2500 mg | INTRAMUSCULAR | Status: DC | PRN
  Filled 2016-05-01: qty 1

## 2016-05-01 MED ORDER — LIDOCAINE HCL (CARDIAC) 20 MG/ML IV SOLN: INTRAVENOUS | Status: DC | PRN

## 2016-05-01 MED ORDER — OXYCODONE HCL 5 MG PO TABS
ORAL_TABLET | ORAL | Status: AC
  Filled 2016-05-01: qty 1

## 2016-05-01 MED ORDER — SODIUM CHLORIDE 0.9% FLUSH
3.0000 mL | Freq: Two times a day (BID) | INTRAVENOUS | Status: DC
  Filled 2016-05-01: qty 3

## 2016-05-01 MED ORDER — FENTANYL CITRATE (PF) 100 MCG/2ML IJ SOLN
25.0000 ug | INTRAMUSCULAR | Status: DC | PRN
  Filled 2016-05-01: qty 1

## 2016-05-01 MED ORDER — PROPOFOL 500 MG/50ML IV EMUL
INTRAVENOUS | Status: AC
  Filled 2016-05-01: qty 50

## 2016-05-01 MED ORDER — OXYCODONE HCL 5 MG PO TABS
5.0000 mg | ORAL_TABLET | ORAL | Status: DC | PRN
  Administered 2016-05-01: 5 mg via ORAL
  Filled 2016-05-01: qty 2

## 2016-05-01 MED ORDER — SUCCINYLCHOLINE CHLORIDE 20 MG/ML IJ SOLN
INTRAMUSCULAR | Status: AC
  Filled 2016-05-01: qty 1

## 2016-05-01 MED ORDER — DEXAMETHASONE SODIUM PHOSPHATE 4 MG/ML IJ SOLN
INTRAMUSCULAR | Status: DC | PRN
  Administered 2016-05-01: 10 mg via INTRAVENOUS

## 2016-05-01 MED ORDER — ONDANSETRON HCL 4 MG/2ML IJ SOLN
INTRAMUSCULAR | Status: DC | PRN
  Administered 2016-05-01: 4 mg via INTRAVENOUS

## 2016-05-01 MED ORDER — MIDAZOLAM HCL 5 MG/5ML IJ SOLN
INTRAMUSCULAR | Status: DC | PRN
  Administered 2016-05-01: 2 mg via INTRAVENOUS

## 2016-05-01 MED ORDER — MIDAZOLAM HCL 2 MG/2ML IJ SOLN
INTRAMUSCULAR | Status: AC
  Filled 2016-05-01: qty 2

## 2016-05-01 SURGICAL SUPPLY — 60 items
APL SKNCLS STERI-STRIP NONHPOA (GAUZE/BANDAGES/DRESSINGS) ×1
BENZOIN TINCTURE PRP APPL 2/3 (GAUZE/BANDAGES/DRESSINGS) ×3 IMPLANT
BLADE HEX COATED 2.75 (ELECTRODE) ×2 IMPLANT
BLADE SURG 10 STRL SS (BLADE) IMPLANT
BLADE SURG 15 STRL LF DISP TIS (BLADE) ×1 IMPLANT
BLADE SURG 15 STRL SS (BLADE) ×2
BRIEF STRETCH FOR OB PAD LRG (UNDERPADS AND DIAPERS) ×3 IMPLANT
CANISTER SUCTION 2500CC (MISCELLANEOUS) ×2 IMPLANT
COVER BACK TABLE 60X90IN (DRAPES) ×2 IMPLANT
COVER MAYO STAND STRL (DRAPES) ×2 IMPLANT
DECANTER SPIKE VIAL GLASS SM (MISCELLANEOUS) ×2 IMPLANT
DRAPE LAPAROTOMY 100X72 PEDS (DRAPES) ×2 IMPLANT
DRAPE UTILITY XL STRL (DRAPES) ×2 IMPLANT
ELECT BLADE 6.5 .24CM SHAFT (ELECTRODE) ×1 IMPLANT
ELECT REM PT RETURN 9FT ADLT (ELECTROSURGICAL) ×2
ELECTRODE REM PT RTRN 9FT ADLT (ELECTROSURGICAL) ×1 IMPLANT
GAUZE SPONGE 4X4 16PLY XRAY LF (GAUZE/BANDAGES/DRESSINGS) IMPLANT
GAUZE VASELINE 3X9 (GAUZE/BANDAGES/DRESSINGS) IMPLANT
GLOVE BIO SURGEON STRL SZ 6.5 (GLOVE) ×2 IMPLANT
GLOVE BIOGEL PI IND STRL 7.5 (GLOVE) IMPLANT
GLOVE BIOGEL PI INDICATOR 7.5 (GLOVE) ×3
GLOVE INDICATOR 7.0 STRL GRN (GLOVE) ×2 IMPLANT
GOWN STRL REUS W/ TWL LRG LVL3 (GOWN DISPOSABLE) ×1 IMPLANT
GOWN STRL REUS W/ TWL XL LVL3 (GOWN DISPOSABLE) ×2 IMPLANT
GOWN STRL REUS W/TWL LRG LVL3 (GOWN DISPOSABLE) ×1 IMPLANT
GOWN STRL REUS W/TWL XL LVL3 (GOWN DISPOSABLE) ×2
HOOK RETRACTION 12 ELAST STAY (MISCELLANEOUS) IMPLANT
HYDROGEN PEROXIDE 16OZ (MISCELLANEOUS) ×2 IMPLANT
IV CATH 18G SAFETY (IV SOLUTION) ×2 IMPLANT
KIT ROOM TURNOVER WOR (KITS) ×2 IMPLANT
LOOP VESSEL MAXI BLUE (MISCELLANEOUS) IMPLANT
MANIFOLD NEPTUNE II (INSTRUMENTS) IMPLANT
NDL HYPO 25X1 1.5 SAFETY (NEEDLE) ×1 IMPLANT
NEEDLE HYPO 25X1 1.5 SAFETY (NEEDLE) ×2 IMPLANT
NS IRRIG 500ML POUR BTL (IV SOLUTION) ×2 IMPLANT
PACK BASIN DAY SURGERY FS (CUSTOM PROCEDURE TRAY) ×2 IMPLANT
PAD ABD 8X10 STRL (GAUZE/BANDAGES/DRESSINGS) ×2 IMPLANT
PAD ARMBOARD 7.5X6 YLW CONV (MISCELLANEOUS) ×3 IMPLANT
PENCIL BUTTON HOLSTER BLD 10FT (ELECTRODE) ×2 IMPLANT
RETRACTOR STERILE 25.8CMX11.3 (INSTRUMENTS) ×2 IMPLANT
SPONGE GAUZE 4X4 12PLY STER LF (GAUZE/BANDAGES/DRESSINGS) ×2 IMPLANT
SPONGE SURGIFOAM ABS GEL 12-7 (HEMOSTASIS) IMPLANT
SUCTION FRAZIER HANDLE 10FR (MISCELLANEOUS) ×1
SUCTION TUBE FRAZIER 10FR DISP (MISCELLANEOUS) ×1 IMPLANT
SUT CHROMIC 2 0 SH (SUTURE) ×2 IMPLANT
SUT CHROMIC 3 0 SH 27 (SUTURE) IMPLANT
SUT ETHIBOND 0 (SUTURE) IMPLANT
SUT SILK 2 0 (SUTURE) ×2
SUT SILK 2 0 TIES 17X18 (SUTURE) ×2
SUT SILK 2-0 18XBRD TIE 12 (SUTURE) IMPLANT
SUT SILK 2-0 18XBRD TIE BLK (SUTURE) ×1 IMPLANT
SUT SILK 3 0 SH 30 (SUTURE) IMPLANT
SUT VIC AB 3-0 SH 18 (SUTURE) ×2 IMPLANT
SUT VICRYL 3 0 UR 6 27 (SUTURE) ×1 IMPLANT
SYR BULB IRRIGATION 50ML (SYRINGE) ×2 IMPLANT
SYR CONTROL 10ML LL (SYRINGE) ×2 IMPLANT
TOWEL OR 17X24 6PK STRL BLUE (TOWEL DISPOSABLE) ×2 IMPLANT
TRAY DSU PREP LF (CUSTOM PROCEDURE TRAY) ×2 IMPLANT
TUBE CONNECTING 12X1/4 (SUCTIONS) ×2 IMPLANT
YANKAUER SUCT BULB TIP NO VENT (SUCTIONS) ×2 IMPLANT

## 2016-05-01 NOTE — Anesthesia Preprocedure Evaluation (Signed)
Anesthesia Evaluation  Patient identified by MRN, date of birth, ID band Patient awake    Reviewed: Allergy & Precautions, NPO status , Patient's Chart, lab work & pertinent test results  Airway Mallampati: II  TM Distance: >3 FB Neck ROM: Full    Dental no notable dental hx.    Pulmonary Current Smoker,    Pulmonary exam normal breath sounds clear to auscultation       Cardiovascular negative cardio ROS Normal cardiovascular exam Rhythm:Regular Rate:Normal     Neuro/Psych negative neurological ROS  negative psych ROS   GI/Hepatic negative GI ROS, Neg liver ROS,   Endo/Other  negative endocrine ROS  Renal/GU negative Renal ROS  negative genitourinary   Musculoskeletal negative musculoskeletal ROS (+)   Abdominal   Peds negative pediatric ROS (+)  Hematology negative hematology ROS (+)   Anesthesia Other Findings   Reproductive/Obstetrics negative OB ROS                             Anesthesia Physical Anesthesia Plan  ASA: II  Anesthesia Plan: General   Post-op Pain Management:    Induction: Intravenous  Airway Management Planned: Oral ETT  Additional Equipment:   Intra-op Plan:   Post-operative Plan: Extubation in OR  Informed Consent: I have reviewed the patients History and Physical, chart, labs and discussed the procedure including the risks, benefits and alternatives for the proposed anesthesia with the patient or authorized representative who has indicated his/her understanding and acceptance.   Dental advisory given  Plan Discussed with: CRNA and Surgeon  Anesthesia Plan Comments:         Anesthesia Quick Evaluation  

## 2016-05-01 NOTE — Transfer of Care (Signed)
Immediate Anesthesia Transfer of Care Note  Patient: Manuel Graham  Procedure(s) Performed: Procedure(s) (LRB): LIGATION OF INTERNAL FISTULA TRACT (N/A)  Patient Location: PACU  Anesthesia Type: General  Level of Consciousness: awake, oriented, sedated and patient cooperative  Airway & Oxygen Therapy: Patient Spontanous Breathing and Patient connected to face mask oxygen  Post-op Assessment: Report given to PACU RN and Post -op Vital signs reviewed and stable  Post vital signs: Reviewed and stable  Complications: No apparent anesthesia complications

## 2016-05-01 NOTE — Progress Notes (Signed)
MULTIPLE ATTEMPTS TO REACH PT VIA PHONE BUT RECORDING STATES VOICEMAIL BOX FULL, UNABLE TO LEAVE MESSAGE.  CALLED AND LEFT MESSAGE TO OR SCHEDULER, DEBBIE, LETTING KNOW AGAIN AFTER RESCHEDULING TO TODAY UNABLE TO REACH PT.   PT WILL NEED HISTORY DONE ON ARRIVAL.

## 2016-05-01 NOTE — Discharge Instructions (Addendum)
Post Anesthesia Home Care Instructions  Activity: Get plenty of rest for the remainder of the day. A responsible adult should stay with you for 24 hours following the procedure.  For the next 24 hours, DO NOT: -Drive a car -Operate machinery -Drink alcoholic beverages -Take any medication unless instructed by your physician -Make any legal decisions or sign important papers.  Meals: Start with liquid foods such as gelatin or soup. Progress to regular foods as tolerated. Avoid greasy, spicy, heavy foods. If nausea and/or vomiting occur, drink only clear liquids until the nausea and/or vomiting subsides. Call your physician if vomiting continues.  Special Instructions/Symptoms: Your throat may feel dry or sore from the anesthesia or the breathing tube placed in your throat during surgery. If this causes discomfort, gargle with warm salt water. The discomfort should disappear within 24 hours.  If you had a scopolamine patch placed behind your ear for the management of post- operative nausea and/or vomiting:  1. The medication in the patch is effective for 72 hours, after which it should be removed.  Wrap patch in a tissue and discard in the trash. Wash hands thoroughly with soap and water. 2. You may remove the patch earlier than 72 hours if you experience unpleasant side effects which may include dry mouth, dizziness or visual disturbances. 3. Avoid touching the patch. Wash your hands with soap and water after contact with the patch.   ANORECTAL SURGERY: POST OP INSTRUCTIONS 1. Take your usually prescribed home medications unless otherwise directed. 2. DIET: During the first few hours after surgery sip on some liquids until you are able to urinate.  It is normal to not urinate for several hours after this surgery.  If you feel uncomfortable, please contact the office for instructions.  After you are able to urinate,you may eat, if you feel like it.  Follow a light bland diet the first 24  hours after arrival home, such as soup, liquids, crackers, etc.  Be sure to include lots of fluids daily (6-8 glasses).  Avoid fast food or heavy meals, as your are more likely to get nauseated.  Eat a low fat diet the next few days after surgery.  Limit caffeine intake to 1-2 servings a day. 3. PAIN CONTROL: a. Pain is best controlled by a usual combination of several different methods TOGETHER: i. Muscle relaxation: Soak in a warm bath (or Sitz bath) three times a day and after bowel movements.  Continue to do this until all pain is resolved. ii. Over the counter pain medication iii. Prescription pain medication b. Most patients will experience some swelling and discomfort in the anus/rectal area and incisions.  Heat such as warm towels, sitz baths, warm baths, etc to help relax tight/sore spots and speed recovery.  Some people prefer to use ice, especially in the first couple days after surgery, as it may decrease the pain and swelling, or alternate between ice & heat.  Experiment to what works for you.  Swelling and bruising can take several weeks to resolve.  Pain can take even longer to completely resolve. c. It is helpful to take an over-the-counter pain medication regularly for the first few weeks.  Choose one of the following that works best for you: i. Naproxen (Aleve, etc)  Two 220mg tabs twice a day ii. Ibuprofen (Advil, etc) Three 200mg tabs four times a day (every meal & bedtime) d. A  prescription for pain medication (such as percocet, oxycodone, hydrocodone, etc) should be given to you upon   discharge.  Take your pain medication as prescribed.  i. If you are having problems/concerns with the prescription medicine (does not control pain, nausea, vomiting, rash, itching, etc), please call us [phone] to see if we need to switch you to a different pain medicine that will work better for you and/or control your side effect better. ii. If you need a refill on your pain medication, please  contact your pharmacy.  They will contact our office to request authorization. Prescriptions will not be filled after 5 pm or on week-ends. 4. KEEP YOUR BOWELS REGULAR and AVOID CONSTIPATION a. The goal is one to two soft bowel movements a day.  You should at least have a bowel movement every other day. b. Avoid getting constipated.  Between the surgery and the pain medications, it is common to experience some constipation. This can be very painful after rectal surgery.  Increasing fluid intake and taking a fiber supplement (such as Metamucil, Citrucel, FiberCon, etc) 1-2 times a day regularly will usually help prevent this problem from occurring.  A stool softener like colace is also recommended.  This can be purchased over the counter at your pharmacy.  You can take it up to 3 times a day.  If you do not have a bowel movement after 24 hrs since your surgery, take one does of milk of magnesia.  If you still haven't had a bowel movement 8-12 hours after that dose, take another dose.  If you don't have a bowel movement 48 hrs after surgery, purchase a Fleets enema from the drug store and administer gently per package instructions.  If you still are having trouble with your bowel movements after that, please call the office for further instructions. c. If you develop diarrhea or have many loose bowel movements, simplify your diet to bland foods & liquids for a few days.  Stop any stool softeners and decrease your fiber supplement.  Switching to mild anti-diarrheal medications (Kayopectate, Pepto Bismol) can help.  If this worsens or does not improve, please call us.  5. Wound Care a. Remove your bandages before your first bowel movement or 8 hours after surgery.     b. Remove any wound packing material at this tim,e as well.  You do not need to repack the wound unless instructed otherwise.  Wear an absorbent pad or soft cotton gauze in your underwear to catch any drainage and help keep the area clean. You  should change this every 2-3 hours while awake. c. Keep the area clean and dry.  Bathe / shower every day, especially after bowel movements.  Keep the area clean by showering / bathing over the incision / wound.   It is okay to soak an open wound to help wash it.  Wet wipes or showers / gentle washing after bowel movements is often less traumatic than regular toilet paper. d. You may have some styrofoam-like soft packing in the rectum which will come out with the first bowel movement.  e. You will often notice bleeding with bowel movements.  This should slow down by the end of the first week of surgery f. Expect some drainage.  This should slow down, too, by the end of the first week of surgery.  Wear an absorbent pad or soft cotton gauze in your underwear until the drainage stops. g. Do Not sit on a rubber or pillow ring.  This can make you symptoms worse.  You may sit on a soft pillow if needed.  6.   ACTIVITIES as tolerated:   a. You may resume regular (light) daily activities beginning the next day--such as daily self-care, walking, climbing stairs--gradually increasing activities as tolerated.  If you can walk 30 minutes without difficulty, it is safe to try more intense activity such as jogging, treadmill, bicycling, low-impact aerobics, swimming, etc. b. Save the most intensive and strenuous activity for last such as sit-ups, heavy lifting, contact sports, etc  Refrain from any heavy lifting or straining until you are off narcotics for pain control.   c. You may drive when you are no longer taking prescription pain medication, you can comfortably sit for long periods of time, and you can safely maneuver your car and apply brakes. d. You may have sexual intercourse when it is comfortable.  7. FOLLOW UP in our office a. Please call [facility] at [phone] to set up an appointment to see your surgeon in the office for a follow-up appointment approximately 3-4 weeks after your surgery. b. Make sure that  you call for this appointment the day you arrive home to insure a convenient appointment time. 10. IF YOU HAVE DISABILITY OR FAMILY LEAVE FORMS, BRING THEM TO THE OFFICE FOR PROCESSING.  DO NOT GIVE THEM TO YOUR DOCTOR.     WHEN TO CALL US [phone]: 1. Poor pain control 2. Reactions / problems with new medications (rash/itching, nausea, etc)  3. Fever over 101.5 F (38.5 C) 4. Inability to urinate 5. Nausea and/or vomiting 6. Worsening swelling or bruising 7. Continued bleeding from incision. 8. Increased pain, redness, or drainage from the incision  The clinic staff is available to answer your questions during regular business hours (8:30am-5pm).  Please don't hesitate to call and ask to speak to one of our nurses for clinical concerns.   A surgeon from [facility] is always on call at the hospitals   If you have a medical emergency, go to the nearest emergency room or call 911.    [facility] [address], [address], [place], [place]  [address] ? MAIN: [phone] ? TOLL FREE: [phone] ? FAX [phone] [email or web address]    

## 2016-05-01 NOTE — Anesthesia Procedure Notes (Signed)
Procedure Name: Intubation Date/Time: 05/01/2016 2:53 PM Performed by: Denna Haggard D Pre-anesthesia Checklist: Patient identified, Emergency Drugs available, Suction available and Patient being monitored Patient Re-evaluated:Patient Re-evaluated prior to inductionOxygen Delivery Method: Circle system utilized Preoxygenation: Pre-oxygenation with 100% oxygen Intubation Type: IV induction Ventilation: Mask ventilation without difficulty Laryngoscope Size: Mac and 3 Grade View: Grade I Tube type: Oral Tube size: 8.0 mm Number of attempts: 1 Airway Equipment and Method: Stylet,  Oral airway and LTA kit utilized Placement Confirmation: ETT inserted through vocal cords under direct vision,  positive ETCO2 and breath sounds checked- equal and bilateral Secured at: 22 cm Tube secured with: Tape Dental Injury: Teeth and Oropharynx as per pre-operative assessment

## 2016-05-01 NOTE — Op Note (Signed)
05/01/2016  3:40 PM  PATIENT:  Manuel Graham  35 y.o. male  Patient Care Team: No Pcp Per Patient as PCP - General (General Practice)  PRE-OPERATIVE DIAGNOSIS:  anal fistula  POST-OPERATIVE DIAGNOSIS:  anal fistula  PROCEDURE:   LIGATION OF INTERNAL FISTULA TRACT   Surgeon(s): Romie LeveeAlicia Ishia Tenorio, MD  ASSISTANT: none   ANESTHESIA:   local and general  SPECIMEN:  No Specimen  DISPOSITION OF SPECIMEN:  N/A  COUNTS:  YES  PLAN OF CARE: Discharge to home after PACU  PATIENT DISPOSITION:  PACU - hemodynamically stable.  INDICATION: 35 year old male with a perianal fistula status post seton placement.   OR FINDINGS: Right anterior perianal fistula with seton intact  DESCRIPTION: the patient was identified in the preoperative holding area and taken to the OR where they were laid on the operating room table.  General anesthesia was induced without difficulty. The patient was then positioned in prone jackknife position with buttocks gently taped apart.  The patient was then prepped and draped in usual sterile fashion.  SCDs were noted to be in place prior to the initiation of anesthesia. A surgical timeout was performed indicating the correct patient, procedure, positioning and need for preoperative antibiotics.  A rectal block was performed using Marcaine with epinephrine.    I began with a digital rectal exam.  The seton appears to have migrated distally.  I then placed a Hill-Ferguson anoscope into the anal canal and evaluated this completely.  The seton exited internally in the distal anal canal.  I made an incision in the anterior internal sphincteric groove. Dissection was carried down around the fistula track on either side. A right angle clamp was used to dissect underneath of the fistula tract. A 2-0 silk suture was used to close down the fistula tract with the right angle. The seton was removed. A 2-0 silk suture was tied tightly. I placed a fistula probe into the external opening  and it did not pass into the intersphincteric groove. I then injected with hydrogen peroxide. There was a small leak anteriorly. This was closed with 2-3 separate 3-0 Vicryl sutures.  There was no more leak of hydrogen peroxide after this. I then reapproximated the intersphincteric groove using the 3-0 Vicryl sutures. The anoderm was reapproximated using 2-0 chromic suture. The external opening was then enlarged to allow for external drainage. The patient tolerated the procedure well and was sent to the post anesthesia care unit in stable condition. All counts were correct per operating room staff.

## 2016-05-01 NOTE — H&P (Signed)
  History of Present Illness Manuel Graham(Manuel Schoenberger MD; 01/18/2016 5:03 PM) The patient is a 35 year old male who presents with a complaint of anal problems. Patient status post seton placement for anal rectal fistula. He complains of quite a bit of perianal drainage causing skin discomfort. Otherwise he is doing well.  Past Medical History:  Diagnosis Date  . Anal pain   . Cough    at night intermittantly non-productive    Past Surgical History Manuel Graham(Manuel Dockery, LPN; 4/0/98117/01/2016 9:144:51 PM) Manuel GreavesEXAM, RECTAL, UNDER ANESTHESIA, WITH SETON PLACEMENT [phone]  Allergies [name](Manuel [place], [place]; 01/18/2016 4:48 PM) No Known Drug Allergies05/16/2017  Medication History [name](Manuel Graham, CMA; 01/18/2016 4:48 PM) Tylenol (325MG  Tablet, Oral) Active. Nitroglycerin (0.4% Ointment, Rectal) Active.  Social History   Social History  . Marital status: Married    Spouse name: N/A  . Number of children: N/A  . Years of education: N/A   Occupational History  . Not on file.   Social History Main Topics  . Smoking status: Current Every Day Smoker    Packs/day: 0.25    Years: 15.00    Types: Cigarettes  . Smokeless tobacco: Former [name]    Types: Chew    Quit date: 12/03/2014  . Alcohol use No  . Drug use: No  . Sexual activity: Yes   Other Topics Concern  . Not on file   Social History Narrative  . No narrative on file   History reviewed. No pertinent family history.  Review of Systems - General ROS: negative for - chills or fever Respiratory ROS: no cough, shortness of breath, or wheezing Cardiovascular ROS: no chest pain or dyspnea on exertion Gastrointestinal ROS: no abdominal pain, change in bowel habits, or black or bloody stools Genito-Urinary ROS: no dysuria, trouble voiding, or hematuria  BP 119/72   Pulse 88   Temp 97.8 F (36.6 C) (Oral)   Resp 16   Ht 5\' 6"  (1.676 m)   Wt 95 kg (209 lb 8 oz)   SpO2 100%   BMI 33.81 kg/m   Physical Exam ) General Mental Status-Alert. General  Appearance-Not in acute distress. Build & Nutrition-Well nourished. Posture-Normal posture. Gait-Normal.  Head and Neck Head-normocephalic, atraumatic with no lesions or palpable masses. Trachea-midline.  Chest and Lung Exam Chest and lung exam reveals -on auscultation, normal breath sounds, no adventitious sounds and normal vocal resonance.  Cardiovascular Cardiovascular examination reveals -normal heart sounds, regular rate and rhythm with no murmurs.  Abdomen Inspection Inspection of the abdomen reveals - No Hernias. Palpation/Percussion Palpation and Percussion of the abdomen reveal - Soft, Non Tender, No Rigidity (guarding), No hepatosplenomegaly and No Palpable abdominal masses.  Rectal Anorectal Exam External - Note: no fissure noted, R anterior fistula.  Neurologic Neurologic evaluation reveals -alert and oriented x 3 with no impairment of recent or remote memory, normal attention span and ability to concentrate, normal sensation and normal coordination.  Musculoskeletal Normal Exam - Bilateral-Upper Extremity Strength Normal and Lower Extremity Strength Normal.    Assessment & Plan ANAL FISTULA (K60.3) Impression: Patient is status post seton placement on 12/05/2015. He presents to the office for follow-up. He is having quite a bit of rectal drainage and skin irritation. On physical exam this appears relatively normal with no signs of subcutaneous abscess or infection.  I have recommended a LIFT procedure to help definitively repair the fistula.  He understands that there is a 20% recurrence rate.

## 2016-05-02 ENCOUNTER — Encounter (HOSPITAL_BASED_OUTPATIENT_CLINIC_OR_DEPARTMENT_OTHER): Payer: Self-pay | Admitting: General Surgery

## 2016-05-02 NOTE — Anesthesia Postprocedure Evaluation (Signed)
Anesthesia Post Note  Patient: [name]  Procedure(s) Performed: Procedure(s) (LRB): LIGATION OF INTERNAL FISTULA TRACT (N/A)  Patient location during evaluation: PACU Anesthesia Type: General Level of consciousness: awake and alert Pain management: pain level controlled Vital Signs Assessment: post-procedure vital signs reviewed and stable Respiratory status: spontaneous breathing, nonlabored ventilation, respiratory function stable and patient connected to nasal cannula oxygen Cardiovascular status: blood pressure returned to baseline and stable Postop Assessment: no signs of nausea or vomiting Anesthetic complications: no     Last Vitals:  Vitals:   05/01/16 1550 05/01/16 1800  BP: (!) 116/98 128/80  Pulse: 96 80  Resp: 10 18  Temp: 36.6 C 36.7 C    Last Pain:  Vitals:   05/01/16 1745  TempSrc:   PainSc: 3    Pain Goal: Patients Stated Pain Goal: 5 (05/01/16 1420)               Phillips Groutarignan, Macenzie Burford

## 2016-09-29 ENCOUNTER — Other Ambulatory Visit: Payer: Self-pay | Admitting: General Surgery

## 2020-10-16 ENCOUNTER — Emergency Department (HOSPITAL_COMMUNITY)
Admission: EM | Admit: 2020-10-16 | Discharge: 2020-10-16 | Discharge status: Against Medical Advice | Payer: Medicaid Other | Source: Home / Self Care | Attending: Emergency Medicine | Admitting: Emergency Medicine

## 2020-10-16 ENCOUNTER — Emergency Department (HOSPITAL_COMMUNITY): Payer: Medicaid Other

## 2020-10-16 ENCOUNTER — Encounter (HOSPITAL_COMMUNITY): Payer: Self-pay

## 2020-10-16 ENCOUNTER — Emergency Department (HOSPITAL_COMMUNITY)
Admission: EM | Admit: 2020-10-16 | Discharge: 2020-10-16 | Disposition: A | Discharge status: Home / Self Care | Payer: Medicaid Other | Attending: Emergency Medicine | Admitting: Emergency Medicine

## 2020-10-16 ENCOUNTER — Other Ambulatory Visit: Payer: Self-pay

## 2020-10-16 DIAGNOSIS — Z5321 Procedure and treatment not carried out due to patient leaving prior to being seen by health care provider: Secondary | ICD-10-CM | POA: Diagnosis not present

## 2020-10-16 DIAGNOSIS — R0602 Shortness of breath: Secondary | ICD-10-CM | POA: Insufficient documentation

## 2020-10-16 DIAGNOSIS — F17200 Nicotine dependence, unspecified, uncomplicated: Secondary | ICD-10-CM | POA: Insufficient documentation

## 2020-10-16 DIAGNOSIS — E119 Type 2 diabetes mellitus without complications: Secondary | ICD-10-CM | POA: Insufficient documentation

## 2020-10-16 DIAGNOSIS — R079 Chest pain, unspecified: Secondary | ICD-10-CM | POA: Insufficient documentation

## 2020-10-16 DIAGNOSIS — R059 Cough, unspecified: Secondary | ICD-10-CM | POA: Insufficient documentation

## 2020-10-16 DIAGNOSIS — F17210 Nicotine dependence, cigarettes, uncomplicated: Secondary | ICD-10-CM | POA: Insufficient documentation

## 2020-10-16 LAB — TROPONIN I (HIGH SENSITIVITY)
Troponin I (High Sensitivity): 7 ng/L (ref <18)
Troponin I (High Sensitivity): 7 ng/L (ref <18)

## 2020-10-16 LAB — CBC WITH DIFFERENTIAL/PLATELET
Abs Immature Granulocytes: 0.05 10*3/uL (ref 0.00–0.07)
Basophils Absolute: 0 10*3/uL (ref 0.0–0.1)
Basophils Relative: 0 %
Eosinophils Absolute: 0.1 10*3/uL (ref 0.0–0.5)
Eosinophils Relative: 2 %
HCT: 41.6 % (ref 39.0–52.0)
Hemoglobin: 14.5 g/dL (ref 13.0–17.0)
Immature Granulocytes: 1 %
Lymphocytes Relative: 64 %
Lymphs Abs: 3.2 10*3/uL (ref 0.7–4.0)
MCH: 29.3 pg (ref 26.0–34.0)
MCHC: 34.9 g/dL (ref 30.0–36.0)
MCV: 84 fL (ref 80.0–100.0)
Monocytes Absolute: 0.5 10*3/uL (ref 0.1–1.0)
Monocytes Relative: 10 %
Neutro Abs: 1.2 10*3/uL — ABNORMAL LOW (ref 1.7–7.7)
Neutrophils Relative %: 23 %
Platelets: 197 10*3/uL (ref 150–400)
RBC: 4.95 MIL/uL (ref 4.22–5.81)
RDW: 12.8 % (ref 11.5–15.5)
WBC: 5 10*3/uL (ref 4.0–10.5)
nRBC: 0 % (ref 0.0–0.2)

## 2020-10-16 LAB — COMPREHENSIVE METABOLIC PANEL
ALT: 84 U/L — ABNORMAL HIGH (ref 0–44)
AST: 48 U/L — ABNORMAL HIGH (ref 15–41)
Albumin: 4.1 g/dL (ref 3.5–5.0)
Alkaline Phosphatase: 70 U/L (ref 38–126)
Anion gap: 7 (ref 5–15)
BUN: 14 mg/dL (ref 6–20)
CO2: 24 mmol/L (ref 22–32)
Calcium: 9.2 mg/dL (ref 8.9–10.3)
Chloride: 105 mmol/L (ref 98–111)
Creatinine, Ser: 0.85 mg/dL (ref 0.61–1.24)
GFR, Estimated: >60 mL/min (ref >60)
Glucose, Bld: 269 mg/dL — ABNORMAL HIGH (ref 70–99)
Potassium: 3.9 mmol/L (ref 3.5–5.1)
Sodium: 136 mmol/L (ref 135–145)
Total Bilirubin: 0.8 mg/dL (ref 0.3–1.2)
Total Protein: 7.5 g/dL (ref 6.5–8.1)

## 2020-10-16 NOTE — ED Notes (Signed)
Patient left AMA. AMA form was printed and patient paper copy. Copy was sent to medical records.

## 2020-10-16 NOTE — ED Triage Notes (Signed)
Pt c/o cp x 3 days. Denies sob, nausea or dizziness, denies cardiac hx

## 2020-10-16 NOTE — ED Triage Notes (Signed)
Emergency Medicine Provider Triage Evaluation Note  Manuel Graham , a 40 y.o. male  was evaluated in triage.  Pt complains of chest pain.  His chest pain has been present for three days.  He reports mild shortness of breath.  He smokes daily a pack a day. No fevers.  No cough.  He was here earlier and left before treatment was complete and had eloped.  He is back wanting his results.  No change in symptoms or condition.   Physical Exam  There were no vitals taken for this visit. Patient is awake and alert, no obvious distress.  Respirations are even and unlabored.  Normal gait.  Answers questions with out difficulty. Generally well appearing.   Medical Decision Making  Medically screening exam initiated at 8:53 PM.  Appropriate orders placed.  Manuel Graham was informed that the remainder of the evaluation will be completed by another provider, this initial triage assessment does not replace that evaluation, and the importance of remaining in the ED until their evaluation is complete.    Cristina Gong, [place] 10/16/20 2054

## 2020-10-16 NOTE — ED Triage Notes (Signed)
Emergency Medicine Provider Triage Evaluation Note  Manuel Graham , a 40 y.o. male  was evaluated in triage.  Pt complains of chest pain.  His chest pain has been present for three days.  He reports mild shortness of breath.  He smokes daily a pack a day. No fevers.  No cough..   Physical Exam  BP 120/73 (BP Location: Left Arm)   Pulse 78   Temp 97.8 F (36.6 C) (Oral)   Resp 18   Ht 5\' 7"  (1.702 m)   Wt 95.3 kg   SpO2 100%   BMI 32.89 kg/m  Patient is awake and alert, no obvious distress, respirations are even and unlabored.    Medical Decision Making  Medically screening exam initiated at 6:25 PM.  Appropriate orders placed.  Manuel Graham was informed that the remainder of the evaluation will be completed by another provider, this initial triage assessment does not replace that evaluation, and the importance of remaining in the ED until their evaluation is complete.    Manuel Graham, Manuel Graham 10/16/20 1826

## 2020-10-16 NOTE — ED Triage Notes (Signed)
Pt returns to ER after leaving. C/o left sided chest pain that has been on going for 2-3 days. Pt reports pain is worst after coughing or taking a deep breath. EKG, XR and initial blood work obtained on previous encounter.

## 2020-10-16 NOTE — ED Notes (Signed)
Pt called 3x for room placement. Eloped from waiting area.  

## 2020-10-16 NOTE — ED Provider Notes (Signed)
[facility]-EMERGENCY DEPT Provider Note   CSN: [id number] Arrival date & time: 10/16/20  2047     History Chief Complaint  Patient presents with  . Chest Pain    Manuel Graham is a 40 y.o. male with a reported history of diabetes.  Patient presents with chief complaint of chest pain and shortness of breath.  Patient reports that his symptoms are only present when he coughs.  Patient reports that he has been coughing for the last 2 to 3 days.  Reports pain is located throughout his entire chest.  Cough is nonproductive.  Patient denies any alleviating or aggravating factors for his cough.    He denies any fevers, chills, nasal congestion, rhinorrhea, sore throat, abdominal pain, nausea, vomiting, syncope.    Denies any hemoptysis, unilateral leg swelling or tenderness, recent surgery or trauma, prior PE or DVT, hormone use, prolonged immobilization.  HPI     Past Medical History:  Diagnosis Date  . Anal pain   . Cough    at night intermittantly non-productive    There are no problems to display for this patient.   Past Surgical History:  Procedure Laterality Date  . EVALUATION UNDER ANESTHESIA WITH ANAL FISTULECTOMY N/A 12/05/2015   Procedure: ANAL EXAM UNDER ANESTHESIA ;  Surgeon: Romie Levee, MD;  Location: [facility];  Service: General;  Laterality: N/A;  . LIGATION OF INTERNAL FISTULA TRACT N/A 05/01/2016   Procedure: LIGATION OF INTERNAL FISTULA TRACT;  Surgeon: Romie Levee, MD;  Location: [facility];  Service: General;  Laterality: N/A;  . LIPOSUCTION TRUNK  2008  . PLACEMENT OF SETON N/A 12/05/2015   Procedure: PLACEMENT OF SETON;  Surgeon: Romie Levee, MD;  Location: [facility];  Service: General;  Laterality: N/A;       No family history on file.  Social History   Tobacco Use  . Smoking status: Current Every Day Smoker    Packs/day: 1.00    Years: 15.00    Pack years: 15.00    Types:  Cigarettes  . Smokeless tobacco: Former Neurosurgeon    Types: Chew    Quit date: 12/03/2014  Substance Use Topics  . Alcohol use: No  . Drug use: No    Home Medications Prior to Admission medications   Medication Sig Start Date End Date Taking? Authorizing Provider  Ascorbic Acid (VITAMIN C) 1000 MG tablet Take 1,000 mg by mouth daily.    [provider]  GLUTATHIONE PO Take 2 tablets by mouth daily.    [provider]  HYDROcodone-acetaminophen (NORCO/VICODIN) 5-325 MG tablet Take 1 tablet by mouth every 4 (four) hours as needed. 12/05/15   Romie Levee, MD  Nitroglycerin 0.4 % OINT Place rectally as needed.    [provider]  oxyCODONE (OXY IR/ROXICODONE) 5 MG immediate release tablet Take 1-2 tablets (5-10 mg total) by mouth every 4 (four) hours as needed. 05/01/16   Romie Levee, MD    Allergies    Patient has no known allergies.  Review of Systems   Review of Systems  Constitutional: Negative for chills and fever.  HENT: Negative for congestion, rhinorrhea and sore throat.   Eyes: Negative for visual disturbance.  Respiratory: Positive for cough and shortness of breath.   Cardiovascular: Positive for chest pain.  Gastrointestinal: Negative for abdominal pain, nausea and vomiting.  Genitourinary: Negative for difficulty urinating and dysuria.  Musculoskeletal: Negative for back pain and neck pain.  Skin: Negative for color change  and rash.  Neurological: Negative for dizziness, syncope, light-headedness and headaches.  Psychiatric/Behavioral: Negative for confusion.    Physical Exam Updated Vital Signs BP 123/85 (BP Location: Right Arm)   Pulse (!) 104   Temp 98.2 F (36.8 C) (Oral)   Resp 16   Ht 5\' 7"  (1.702 m)   Wt 95.2 kg   SpO2 100%   BMI 32.88 kg/m   Physical Exam Vitals and nursing note reviewed.  Constitutional:      General: He is not in acute distress.    Appearance: He is not ill-appearing, toxic-appearing or diaphoretic.   HENT:     Head: Normocephalic.  Eyes:     General: No scleral icterus.       Right eye: No discharge.        Left eye: No discharge.  Cardiovascular:     Rate and Rhythm: Normal rate.     Heart sounds: Normal heart sounds.  Pulmonary:     Effort: Pulmonary effort is normal. No tachypnea or respiratory distress.     Breath sounds: Normal breath sounds. No stridor.  Chest:     Chest wall: No mass, lacerations, deformity, swelling, tenderness, crepitus or edema.  Abdominal:     General: Abdomen is protuberant. There is no distension. There are no signs of injury.     Palpations: Abdomen is soft. There is no mass or pulsatile mass.     Tenderness: There is no abdominal tenderness. There is no guarding or rebound.     Hernia: There is no hernia in the umbilical area or ventral area.  Musculoskeletal:     Cervical back: Neck supple.     Right lower leg: No tenderness. No edema.     Left lower leg: No tenderness. No edema.  Skin:    General: Skin is warm and dry.     Coloration: Skin is not cyanotic or pale.  Neurological:     General: No focal deficit present.     Mental Status: He is alert.  Psychiatric:        Behavior: Behavior is cooperative.     ED Results / Procedures / Treatments   Labs (all labs ordered are listed, but only abnormal results are displayed) Labs Reviewed  D-DIMER, QUANTITATIVE  TROPONIN I (HIGH SENSITIVITY)    EKG None  Radiology DG Chest 2 View  Result Date: 10/16/2020 CLINICAL DATA:  40 year old male with chest pain. EXAM: CHEST - 2 VIEW COMPARISON:  Chest radiograph dated 10/16/2010. FINDINGS: The heart size and mediastinal contours are within normal limits. Both lungs are clear. The visualized skeletal structures are unremarkable. IMPRESSION: No active cardiopulmonary disease. Electronically Signed   By: 12/16/2010 M.D.   On: 10/16/2020 19:10    Procedures Procedures   Medications Ordered in ED Medications - No data to display  ED  Course  I have reviewed the triage vital signs and the nursing notes.  Pertinent labs & imaging results that were available during my care of the patient were reviewed by me and considered in my medical decision making (see chart for details).    MDM Rules/Calculators/A&P                          Alert 40 year old male no acute distress, nontoxic-appearing.  Patient presents with chief complaint of chest pain and shortness of breath whenever he coughs.  Patient reports he has been having the symptoms over the last 2-3 days.  Patient  was seen earlier today at [facility] and had MSE screening.  Patient eloped prior to being seen by provider.  Patient's initial troponin 7, patient left before repeat troponin could be collected.   CBC was unremarkable.   CMP showed AST and ALT elevated at 48 and 84 respectively.  12 years prior AST was 43 and ALT 71.  CBG elevated at 269.  Patient reports that he has a history of diabetes.  Due to patient complaint low suspicion for PE; will obtain D-dimer. We will repeat EKG and troponin.  After speaking with patient and conducting physical exam patient reported that he needed to leave to care for his daughter.  Discussed with patient that without further testing we cannot clearly identify the cause of his symptoms.  Patient was told that he may have a blood clot in his lungs causing his symptoms and if untreated this could lead to worsening of symptoms and include need to death.  She was told that leaving would be AGAINST MEDICAL ADVICE.  Patient reports he understands this risk and still wants to leave.    Final Clinical Impression(s) / ED Diagnoses Final diagnoses:  Eloped from emergency department    Rx / DC Orders ED Discharge Orders    None       Berneice Heinrich 10/16/20 2256    Tilden Fossa, MD 10/17/20 [phone]

## 2020-10-22 ENCOUNTER — Encounter (HOSPITAL_COMMUNITY): Payer: Self-pay | Admitting: Emergency Medicine

## 2020-10-22 ENCOUNTER — Emergency Department (HOSPITAL_COMMUNITY)
Admission: EM | Admit: 2020-10-22 | Discharge: 2020-10-23 | Disposition: A | Discharge status: Home / Self Care | Payer: Medicaid Other | Attending: Emergency Medicine | Admitting: Emergency Medicine

## 2020-10-22 ENCOUNTER — Other Ambulatory Visit: Payer: Self-pay

## 2020-10-22 DIAGNOSIS — Z7984 Long term (current) use of oral hypoglycemic drugs: Secondary | ICD-10-CM | POA: Insufficient documentation

## 2020-10-22 DIAGNOSIS — F17210 Nicotine dependence, cigarettes, uncomplicated: Secondary | ICD-10-CM | POA: Diagnosis not present

## 2020-10-22 DIAGNOSIS — E1165 Type 2 diabetes mellitus with hyperglycemia: Secondary | ICD-10-CM | POA: Insufficient documentation

## 2020-10-22 DIAGNOSIS — R739 Hyperglycemia, unspecified: Secondary | ICD-10-CM

## 2020-10-22 LAB — URINALYSIS, ROUTINE W REFLEX MICROSCOPIC
Bacteria, UA: NONE SEEN
Bilirubin Urine: NEGATIVE
Glucose, UA: >=500 mg/dL — AB
Hgb urine dipstick: NEGATIVE
Ketones, ur: NEGATIVE mg/dL
Leukocytes,Ua: NEGATIVE
Nitrite: NEGATIVE
Protein, ur: NEGATIVE mg/dL
Specific Gravity, Urine: 1.027 (ref 1.005–1.030)
pH: 6 (ref 5.0–8.0)

## 2020-10-22 LAB — BASIC METABOLIC PANEL
Anion gap: 10 (ref 5–15)
BUN: 18 mg/dL (ref 6–20)
CO2: 22 mmol/L (ref 22–32)
Calcium: 9.2 mg/dL (ref 8.9–10.3)
Chloride: 102 mmol/L (ref 98–111)
Creatinine, Ser: 1.01 mg/dL (ref 0.61–1.24)
GFR, Estimated: >60 mL/min (ref >60)
Glucose, Bld: 578 mg/dL (ref 70–99)
Potassium: 4.5 mmol/L (ref 3.5–5.1)
Sodium: 134 mmol/L — ABNORMAL LOW (ref 135–145)

## 2020-10-22 LAB — CBC
HCT: 46.3 % (ref 39.0–52.0)
Hemoglobin: 16.1 g/dL (ref 13.0–17.0)
MCH: 29.8 pg (ref 26.0–34.0)
MCHC: 34.8 g/dL (ref 30.0–36.0)
MCV: 85.6 fL (ref 80.0–100.0)
Platelets: 178 10*3/uL (ref 150–400)
RBC: 5.41 MIL/uL (ref 4.22–5.81)
RDW: 13.1 % (ref 11.5–15.5)
WBC: 5.2 10*3/uL (ref 4.0–10.5)
nRBC: 0 % (ref 0.0–0.2)

## 2020-10-22 LAB — CBG MONITORING, ED: Glucose-Capillary: 516 mg/dL (ref 70–99)

## 2020-10-22 MED ORDER — LACTATED RINGERS IV BOLUS
1000.0000 mL | Freq: Once | INTRAVENOUS | Status: AC
  Administered 2020-10-22: 1000 mL via INTRAVENOUS

## 2020-10-22 NOTE — ED Notes (Signed)
Pt attempting to provide urine specimen

## 2020-10-22 NOTE — ED Notes (Addendum)
Pt. Requesting, "Insulin because his blood sugar is high and he doesn't want to go into coma." RN, Riki Rusk made aware. Pt. Refused to get into gown.

## 2020-10-22 NOTE — ED Triage Notes (Signed)
Patient is complaining of having a high blood sugar all day. Patient states his sugars have been in the 500's today. Patient also states he has blurry vision and numbness in his fingers.

## 2020-10-22 NOTE — ED Provider Notes (Signed)
[facility]-EMERGENCY DEPT Provider Note   CSN: [id number] Arrival date & time: 10/22/20  2132     History Chief Complaint  Patient presents with  . Hyperglycemia    Manuel Graham is a 40 y.o. male.  The history is provided by the patient and medical records.  Hyperglycemia  Manuel Graham is a 40 y.o. male who presents to the Emergency Department complaining of hyperglycemia.  He presents to the ED complaining of hyperglycemia for the last three hours. He has blurred vision, fatigue, numbness in all four extremities.  He was fasting today and broke his fast two hours ago with small amount of bread and fish and a sugary drink.  Last ate previously around 3 am this morning.  Felt warm today, no reports of fevers.  No chest pain.    A few days ago he had "lung pain" with coughing. Has not been coughing much.  Now completely resolved.    He has a hx/o diabetes but only takes his medications intermittently, particularly when he is fasting.  Took metformin once today at 1000mg .    Has a family hx/o diabetes.    Smokes tobacco (vape), no alcohol, drugs.     Past Medical History:  Diagnosis Date  . Anal pain   . Cough    at night intermittantly non-productive    There are no problems to display for this patient.   Past Surgical History:  Procedure Laterality Date  . EVALUATION UNDER ANESTHESIA WITH ANAL FISTULECTOMY N/A 12/05/2015   Procedure: ANAL EXAM UNDER ANESTHESIA ;  Surgeon: 12/07/2015, MD;  Location: [facility];  Service: General;  Laterality: N/A;  . LIGATION OF INTERNAL FISTULA TRACT N/A 05/01/2016   Procedure: LIGATION OF INTERNAL FISTULA TRACT;  Surgeon: 05/03/2016, MD;  Location: [facility];  Service: General;  Laterality: N/A;  . LIPOSUCTION TRUNK  2008  . PLACEMENT OF SETON N/A 12/05/2015   Procedure: PLACEMENT OF SETON;  Surgeon: 12/07/2015, MD;  Location: [facility];  Service: General;   Laterality: N/A;       History reviewed. No pertinent family history.  Social History   Tobacco Use  . Smoking status: Current Every Day Smoker    Packs/day: 1.00    Years: 15.00    Pack years: 15.00    Types: Cigarettes  . Smokeless tobacco: Former [facility]    Types: Chew    Quit date: 12/03/2014  Substance Use Topics  . Alcohol use: No  . Drug use: No    Home Medications Prior to Admission medications   Medication Sig Start Date End Date Taking? Authorizing Provider  Ascorbic Acid (VITAMIN C) 1000 MG tablet Take 1,000 mg by mouth daily.   Yes [provider]  GLUTATHIONE PO Take 2 tablets by mouth daily.   Yes [provider]  metFORMIN (GLUCOPHAGE) 500 MG tablet Take 1,000 mg by mouth 2 (two) times daily.   Yes [provider]    Allergies    Patient has no known allergies.  Review of Systems   Review of Systems  All other systems reviewed and are negative.   Physical Exam Updated Vital Signs BP 123/70   Pulse 72   Temp (!) 97.5 F (36.4 C) (Oral)   Resp 18   Ht 5\' 7"  (1.702 m)   SpO2 98%   BMI 32.88 kg/m   Physical Exam Vitals and nursing note reviewed.  Constitutional:      Appearance:  He is well-developed.  HENT:     Head: Normocephalic and atraumatic.  Cardiovascular:     Rate and Rhythm: Normal rate and regular rhythm.     Heart sounds: No murmur heard.   Pulmonary:     Effort: Pulmonary effort is normal. No respiratory distress.     Breath sounds: Normal breath sounds.  Abdominal:     Palpations: Abdomen is soft.     Tenderness: There is no abdominal tenderness. There is no guarding or rebound.  Musculoskeletal:        General: No swelling or tenderness.  Skin:    General: Skin is warm and dry.  Neurological:     Mental Status: He is alert and oriented to person, place, and time.  Psychiatric:        Behavior: Behavior normal.     ED Results / Procedures / Treatments   Labs (all labs ordered are listed,  but only abnormal results are displayed) Labs Reviewed  BASIC METABOLIC PANEL - Abnormal; Notable for the following components:      Result Value   Sodium 134 (*)    Glucose, Bld 578 (*)    All other components within normal limits  URINALYSIS, ROUTINE W REFLEX MICROSCOPIC - Abnormal; Notable for the following components:   Color, Urine STRAW (*)    Glucose, UA >=500 (*)    All other components within normal limits  CBG MONITORING, ED - Abnormal; Notable for the following components:   Glucose-Capillary 516 (*)    All other components within normal limits  CBG MONITORING, ED - Abnormal; Notable for the following components:   Glucose-Capillary 498 (*)    All other components within normal limits  CBC    EKG None  Radiology No results found.  Procedures Procedures   Medications Ordered in ED Medications  insulin aspart (novoLOG) injection 8 Units (has no administration in time range)  lactated ringers bolus 1,000 mL (0 mLs Intravenous Stopped 10/22/20 2328)  lactated ringers bolus 1,000 mL (0 mLs Intravenous Stopped 10/23/20 0023)    ED Course  I have reviewed the triage vital signs and the nursing notes.  Pertinent labs & imaging results that were available during my care of the patient were reviewed by me and considered in my medical decision making (see chart for details).    MDM Rules/Calculators/A&P                         patient with history of diabetes here for evaluation of hyperglycemia after breaking a fast. He is inconsistent with taking his metformin. On evaluation he is non-toxic appearing. He does have significant elevation is glucose without electrolyte abnormalities. He was treated with IV fluid bolus with minimal change in his blood sugar. Will provide additional dose of insulin. Anticipate discharge home after medications and reassessment of his blood sugar. Discussed with patient proper use of metformin as well as diabetic diet.  Final Clinical  Impression(s) / ED Diagnoses Final diagnoses:  None    Rx / DC Orders ED Discharge Orders    None       Tilden Fossa, MD 10/23/20 [phone]

## 2020-10-23 LAB — CBG MONITORING, ED
Glucose-Capillary: 307 mg/dL — ABNORMAL HIGH (ref 70–99)
Glucose-Capillary: 401 mg/dL — ABNORMAL HIGH (ref 70–99)
Glucose-Capillary: 498 mg/dL — ABNORMAL HIGH (ref 70–99)

## 2020-10-23 MED ORDER — INSULIN ASPART 100 UNIT/ML ~~LOC~~ SOLN
10.0000 [IU] | Freq: Once | SUBCUTANEOUS | Status: AC
  Administered 2020-10-23: 10 [IU] via SUBCUTANEOUS
  Filled 2020-10-23: qty 0.1

## 2020-10-23 MED ORDER — INSULIN ASPART 100 UNIT/ML ~~LOC~~ SOLN
8.0000 [IU] | Freq: Once | SUBCUTANEOUS | Status: AC
  Administered 2020-10-23: 8 [IU] via SUBCUTANEOUS
  Filled 2020-10-23: qty 0.08

## 2020-10-23 MED ORDER — METFORMIN HCL 500 MG PO TABS: 500.0000 mg | ORAL_TABLET | Freq: Every day | ORAL | 0 refills | Status: AC

## 2020-10-23 NOTE — Discharge Instructions (Addendum)
Please take your Metformin as prescribed. Follow up with your doctor in the next 2-3 days for recheck. Check your blood sugar 3-4 times daily until it stabilizes. Return to the ED with any new or concerning symptoms.

## 2020-10-23 NOTE — ED Provider Notes (Signed)
T2DM, "I don't have DM" Hyperglycemia Fasting today, today with bread Received fluids without improvement, has received insulin Recheck CBG 1:30  Anticipate discharge  1:55 - CBG 401. Patient continues to feel better.   Additional insulin felt indicated prior to discharge. 10 U regular insulin given. Will recheck in one hour.  CBG down to 307. The patient is felt appropriate for discharge home. Encourage regular use of his Metformin instead of intermittent use.    Elpidio Anis, PA-C 10/23/20 0321    Tilden Fossa, MD 10/23/20 [phone]

## 2020-11-08 ENCOUNTER — Encounter: Payer: Self-pay | Admitting: Endocrinology

## 2022-03-09 IMAGING — CR DG CHEST 2V
2 series · 2 of 2 positions shown · non-contrast
Comparison: Chest radiograph dated 10/16/2010.

CLINICAL DATA: 40-year-old male with chest pain.

EXAM:
CHEST - 2 VIEW

[w chest pa]
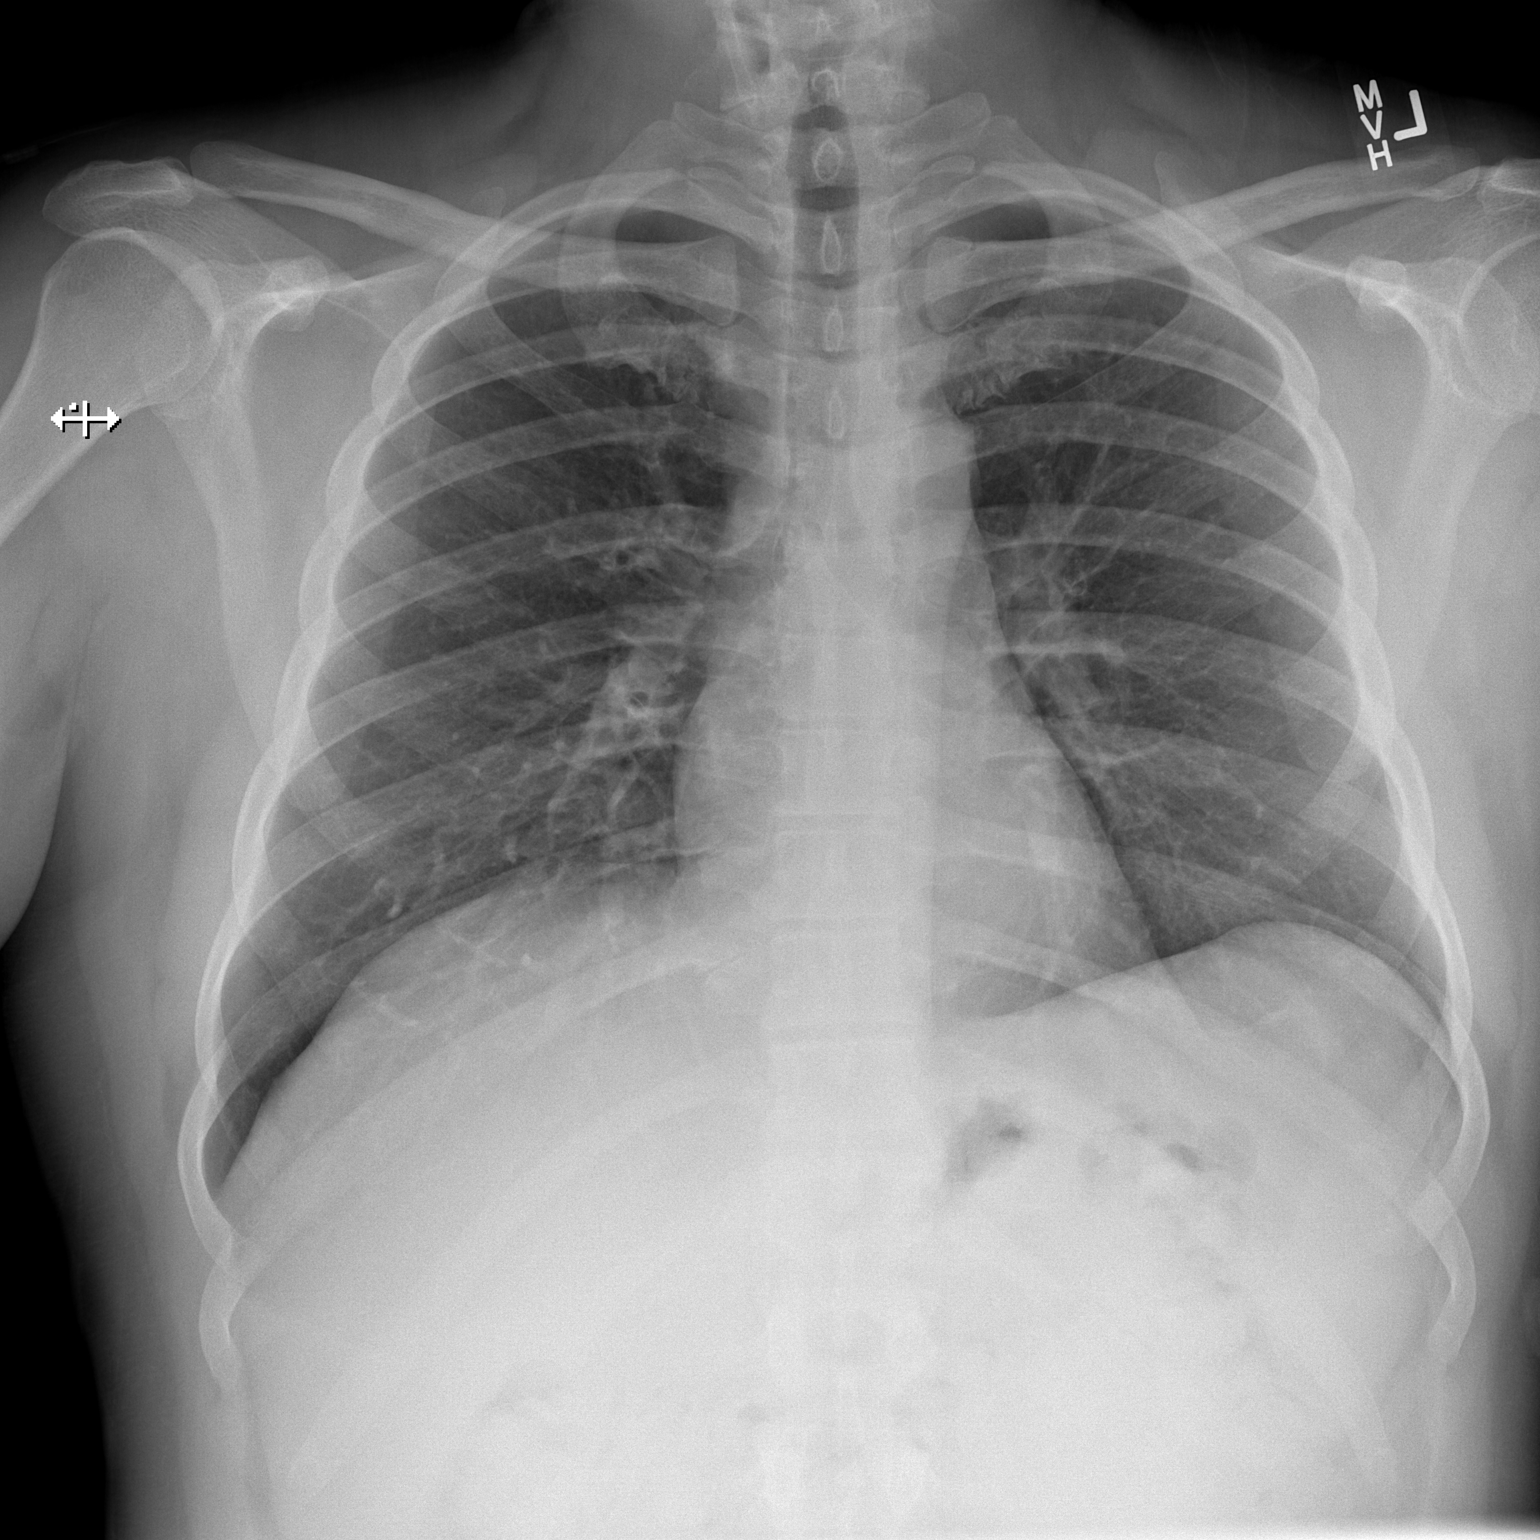

[w chest lat]
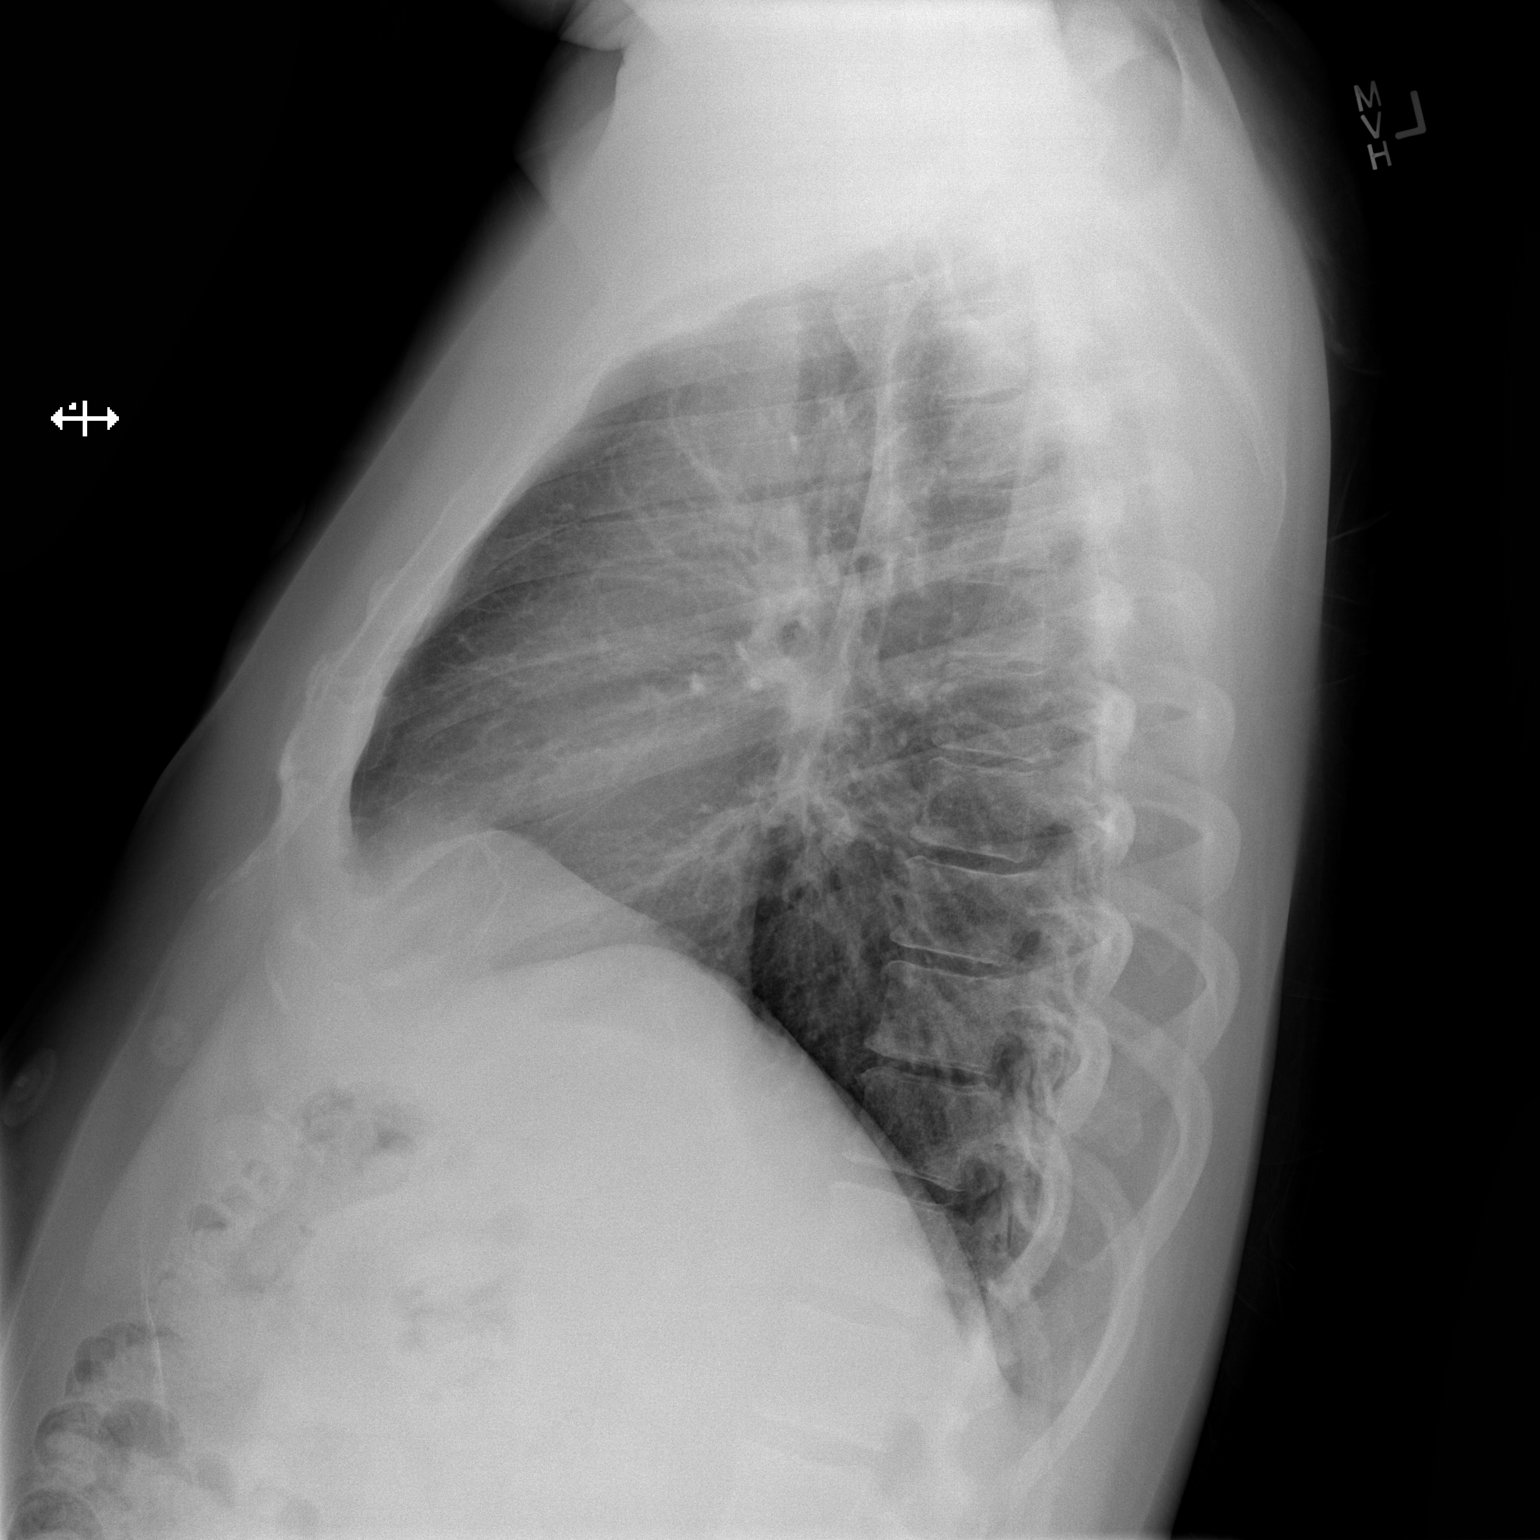

[2 of 2 positions shown; findings below may reference images not displayed]

FINDINGS: The heart size and mediastinal contours are within normal limits.
Both lungs are clear. The visualized skeletal structures are
unremarkable.
IMPRESSION: No active cardiopulmonary disease.

## 2023-06-16 ENCOUNTER — Encounter (HOSPITAL_COMMUNITY): Payer: Self-pay

## 2023-06-16 ENCOUNTER — Emergency Department (HOSPITAL_COMMUNITY): Payer: Medicaid Other

## 2023-06-16 ENCOUNTER — Emergency Department (HOSPITAL_COMMUNITY)
Admission: EM | Admit: 2023-06-16 | Discharge: 2023-06-17 | Disposition: A | Discharge status: Home / Self Care | Payer: Medicaid Other | Attending: Emergency Medicine | Admitting: Emergency Medicine

## 2023-06-16 DIAGNOSIS — S6992XA Unspecified injury of left wrist, hand and finger(s), initial encounter: Secondary | ICD-10-CM | POA: Diagnosis present

## 2023-06-16 DIAGNOSIS — W208XXA Other cause of strike by thrown, projected or falling object, initial encounter: Secondary | ICD-10-CM | POA: Diagnosis not present

## 2023-06-16 DIAGNOSIS — M79645 Pain in left finger(s): Secondary | ICD-10-CM | POA: Insufficient documentation

## 2023-06-16 DIAGNOSIS — Y92000 Kitchen of unspecified non-institutional (private) residence as  the place of occurrence of the external cause: Secondary | ICD-10-CM | POA: Diagnosis not present

## 2023-06-16 NOTE — ED Triage Notes (Signed)
Pt states was lifting something 2 days ago from the kitchen and plate fell on left hand, middle finger swollen and painful.

## 2023-06-17 NOTE — ED Provider Notes (Signed)
[place] [facility] DEPARTMENT AT [facility] Provider Note   CSN: [id number] Arrival date & time: 06/16/23  1752     History  Chief Complaint  Patient presents with   Finger Injury    Manuel Graham is a 42 y.o. male.  42 year old male presents today for concern of crush injury to the left middle finger.  He states a heavy plate fell on his finger.  This occurred 2 days ago.  Denies other complaints.  Has had swelling since then.  Pain with range of motion.  He is right-hand dominant.  The history is provided by the patient. No language interpreter was used.       Home Medications Prior to Admission medications   Medication Sig Start Date End Date Taking? Authorizing Provider  Ascorbic Acid (VITAMIN C) 1000 MG tablet Take 1,000 mg by mouth daily.    [provider]  GLUTATHIONE PO Take 2 tablets by mouth daily.    [provider]  metFORMIN (GLUCOPHAGE) 500 MG tablet Take 1 tablet (500 mg total) by mouth daily with breakfast. 10/23/20   Tilden Fossa, MD      Allergies    Patient has no known allergies.    Review of Systems   Review of Systems  Respiratory:  Negative for shortness of breath.   Musculoskeletal:  Positive for arthralgias and joint swelling.  All other systems reviewed and are negative.   Physical Exam Updated Vital Signs BP (!) 125/96   Pulse 74   Temp 98 F (36.7 C) (Oral)   Resp 18   Ht 5\' 6"  (1.676 m)   Wt 97.5 kg   SpO2 98%   BMI 34.70 kg/m  Physical Exam Vitals and nursing note reviewed.  Constitutional:      General: He is not in acute distress.    Appearance: Normal appearance. He is not ill-appearing.  HENT:     Head: Normocephalic and atraumatic.     Nose: Nose normal.  Eyes:     Conjunctiva/sclera: Conjunctivae normal.  Cardiovascular:     Rate and Rhythm: Normal rate and regular rhythm.  Pulmonary:     Effort: Pulmonary effort is normal. No respiratory distress.  Musculoskeletal:         General: No deformity.     Comments: Swelling noted to left middle finger.  Pain with tenderness to palpation.  Has good flexion but unable to fully flex.  Pain with extension as well.  Without tenderness palpation of the flexor tendon sheath.  Neurovascularly intact in the left upper extremity.  Skin:    Findings: No rash.  Neurological:     Mental Status: He is alert.     ED Results / Procedures / Treatments   Labs (all labs ordered are listed, but only abnormal results are displayed) Labs Reviewed - No data to display  EKG None  Radiology DG Hand Complete Left  Result Date: 06/16/2023 CLINICAL DATA:  Pain after injury.  Swelling of the middle finger. EXAM: LEFT HAND - COMPLETE 3+ VIEW COMPARISON:  None Available. FINDINGS: There is no evidence of fracture or dislocation. Particularly, no fracture or dislocation of the middle finger. Corticated density distal the ulna styloid suggestive of remote prior injury. Soft tissue edema of the middle finger. No radiopaque foreign body or soft tissue gas. IMPRESSION: Soft tissue edema of the middle finger. No acute fracture or subluxation. Electronically Signed   By: Narda Rutherford M.D.   On: 06/16/2023 20:11    Procedures  Procedures    Medications Ordered in ED Medications - No data to display  ED Course/ Medical Decision Making/ A&P                                 Medical Decision Making Amount and/or Complexity of Data Reviewed Radiology: ordered.   42 year old male presents today for concern of pain to the left middle finger.  Has diffuse swelling.  X-ray without evidence of fracture.  Right-hand-dominant.  Neurovascularly intact.  Likely soft tissue injury.  Hand referral given.  Placed in a static splint.  Prior to his discharge paperwork being printed he requested to leave due to extended wait.  His discharge was already prepared.  I provided patient with the hand surgeons clinic information as well as internal medicine  clinic information on a separate sheet of paper.  I also placed the static splint on the left middle finger.  Discharged in stable condition.   Final Clinical Impression(s) / ED Diagnoses Final diagnoses:  Injury of finger of left hand, initial encounter    Rx / DC Orders ED Discharge Orders     None         Marita Kansas, PA-C 06/17/23 0203    Terald Sleeper, MD 06/17/23 [phone]

## 2023-06-17 NOTE — Discharge Instructions (Addendum)
No fracture on the x-ray.  You likely have a tendon injury.  Splint was provided.  Take Tylenol as you need to for pain control along with ibuprofen.  Return for any concerning symptoms.  Establish a primary care provider with the clinic listed above.  Hand surgery information listed for as needed follow-up.
